# Patient Record
Sex: Female | Born: 1945 | Hispanic: No | Marital: Married | Smoking: Never smoker
Health system: Southern US, Community
[De-identification: ages and names within clinical notes are randomized; demographics above are authoritative.]

## PROBLEM LIST (undated history)

## (undated) DIAGNOSIS — D649 Anemia, unspecified: Secondary | ICD-10-CM

## (undated) DIAGNOSIS — I10 Essential (primary) hypertension: Secondary | ICD-10-CM

## (undated) DIAGNOSIS — E785 Hyperlipidemia, unspecified: Secondary | ICD-10-CM

## (undated) DIAGNOSIS — G629 Polyneuropathy, unspecified: Secondary | ICD-10-CM

## (undated) DIAGNOSIS — E119 Type 2 diabetes mellitus without complications: Secondary | ICD-10-CM

## (undated) HISTORY — PX: COMBINED HYSTEROSCOPY DIAGNOSTIC / D&C: SUR297

---

## 2000-03-18 ENCOUNTER — Other Ambulatory Visit: Admission: RE | Admit: 2000-03-18 | Discharge: 2000-03-18 | Payer: Self-pay | Admitting: Family Medicine

## 2015-02-12 ENCOUNTER — Other Ambulatory Visit: Payer: Self-pay | Admitting: Family Medicine

## 2015-02-12 DIAGNOSIS — R946 Abnormal results of thyroid function studies: Secondary | ICD-10-CM

## 2015-02-17 ENCOUNTER — Ambulatory Visit
Admission: RE | Admit: 2015-02-17 | Discharge: 2015-02-17 | Disposition: A | Discharge status: Home / Self Care | Payer: Self-pay | Source: Ambulatory Visit | Attending: Family Medicine | Admitting: Family Medicine

## 2015-02-17 DIAGNOSIS — R946 Abnormal results of thyroid function studies: Secondary | ICD-10-CM

## 2015-03-24 ENCOUNTER — Ambulatory Visit (INDEPENDENT_AMBULATORY_CARE_PROVIDER_SITE_OTHER): Payer: BLUE CROSS/BLUE SHIELD | Admitting: Internal Medicine

## 2015-03-24 ENCOUNTER — Encounter: Payer: Self-pay | Admitting: Internal Medicine

## 2015-03-24 VITALS — BP 132/78 | HR 99 | Temp 97.8°F | Resp 12 | Ht 62.75 in | Wt 225.0 lb

## 2015-03-24 DIAGNOSIS — E042 Nontoxic multinodular goiter: Secondary | ICD-10-CM | POA: Diagnosis not present

## 2015-03-24 DIAGNOSIS — R946 Abnormal results of thyroid function studies: Secondary | ICD-10-CM

## 2015-03-24 DIAGNOSIS — R7989 Other specified abnormal findings of blood chemistry: Secondary | ICD-10-CM

## 2015-03-24 LAB — TSH: TSH: 0.32 u[IU]/mL — AB (ref 0.35–4.50)

## 2015-03-24 LAB — T3, FREE: T3, Free: 3.8 pg/mL (ref 2.3–4.2)

## 2015-03-24 LAB — T4, FREE: FREE T4: 1.01 ng/dL (ref 0.60–1.60)

## 2015-03-24 NOTE — Patient Instructions (Signed)
Please stop at the lab.  If the thyroid tests are abnormal, we may need a thyroid uptake and scan.  Please return in 4 months.

## 2015-03-24 NOTE — Progress Notes (Signed)
Patient ID: Felicia Davidson, female   DOB: Jul 14, 1945, 70 y.o.   MRN: [id number]   HPI  Felicia Davidson is a 70 y.o.-year-old female, referred by her PCP, Dr. Hal Hope, for evaluation and management of multinodular goiter and a low TSH.  She was found to have a low TSH at last appt with PCP in 01/2015 >> sent for a thyroid U/S.  Thyroid U/S (02/17/2015): Several areas of heterogeneity: There is marked diffuse heterogeneity of the thyroid parenchymal echotexture.  Right thyroid lobe: Borderline enlarged measuring 6.1 x 3.0 x 3.0 cm.  Right , mid, lateral - 1.5 x 0.7 x 1.0 cm - mixed echogenic, predominantly cystic with internal echogenic septations, ill-defined, potentially a pseudo nodule.  *Right, inferior - 2.1 x 2.0 x 1.3 cm - mixed echogenic, solid, ill-defined, potentially pseudo nodule.  There are several additional scattered punctate (sub 9 mm) complex cystic nodules scattered within the remainder the right lobe of the thyroid  Left thyroid lobe: Borderline enlarged measures 6.3 x 2.8 x 2.8 cm.  Left, mid, lateral - 1.4 x 1.0 x 1.2 cm - anechoic cystic with internal echogenic septations  *Left, mid, posterior - 1.7 x 1.1 x 1.9 cm - echogenic, ill-defined, solid, potentially a pseudo nodule.  *Left, inferior, posterior - 1.4 x 1.4 x 2.0 cm - mixed echogenic, solid, ill-defined, potentially pseudo nodule  Isthmus: Borderline enlarged measuring 0.8 cm in diameter.   Left, lateral - 0.4 cm - anechoic, likely cystic with peripheral eccentric nodule suggestive of colloid  Lymphadenopathy: None visualized.  IMPRESSION: Findings suggestive of multi nodular goiter.     Pt denies: - feeling nodules in neck - hoarseness - dysphagia - choking - SOB with lying down  I reviewed pt's thyroid tests: 02/05/2015: TSH 0.285 (0.45-4.5)  Pt c/o: - + fatigue - no heat intolerance/cold intolerance - no tremors - no palpitations - no anxiety/depression - no  hyperdefecation/+ occasional constipation - no weight loss/weight gain - no dry skin - no hair loss  No FH of thyroid ds. No FH of thyroid cancer. No h/o radiation tx to head or neck.  No seaweed or kelp. No recent contrast studies. No steroid use. No herbal supplements. No Biotin supplements or Hair, Skin and Nails vitamins.  Pt also has a history of anemia, heart murmur. She had an EGD this mo >> normal. She also has a h/o HTN, HL.  ROS: Constitutional: see HPI Eyes: no blurry vision, no xerophthalmia ENT: no sore throat, + see HPI, + decreased hearing Cardiovascular: no CP/SOB/palpitations/leg swelling Respiratory: no cough/SOB Gastrointestinal: no N/V/D/C Musculoskeletal:+ muscle/+ joint aches Skin: no rashes Neurological: no tremors/numbness/tingling/dizziness Psychiatric: no depression/anxiety  PMH: see HPI.  PSxH: none  Social History   Social History  . Marital Status: married    Spouse Name: N/A  . Number of Children: 0   Occupational History  . retired   Social History Main Topics  . Smoking status: nonsmoker  . Smokeless tobacco: Not on file  . Alcohol Use: No  . Drug Use: No   Patient's medicines: - Metformin 1000 mg twice a day - Glipizide 10 mg twice a day - Calcium with minerals 600 mg twice a day - Lisinopril 20 mg twice a day - Aspirin 81 mg in a.m. - Centrum Silver 1 tablet in a.m. - Aleve 220 mg twice a day - Iron supplement 325 mg in a.m. - Rosuvastatin 10 mg at supper  Allergies  Allergen Reactions  . Lipitor [Atorvastatin] Other (See Comments)  Sore muscles  . Penicillins Other (See Comments)    Stiff joints   FH: - see HPI - father with heart ds. (deceased)  PE: BP 132/78 mmHg  Pulse 99  Temp(Src) 97.8 F (36.6 C) (Oral)  Resp 12  Ht 5' 2.75" (1.594 m)  Wt 225 lb (102.059 kg)  BMI 40.17 kg/m2  SpO2 97% Wt Readings from Last 3 Encounters:  03/24/15 225 lb (102.059 kg)   Constitutional: obese, in NAD Eyes: PERRLA,  EOMI, no exophthalmos ENT: moist mucous membranes, no thyromegaly, no cervical lymphadenopathy Cardiovascular: RRR, No MRG Respiratory: CTA B Gastrointestinal: abdomen soft, NT, ND, BS+ Musculoskeletal: no deformities, strength intact in all 4;  Skin: moist, warm, no rashes Neurological: no tremor with outstretched hands, DTR normal in all 4  ASSESSMENT: 1. MNG  2. Low TSH  PLAN: 1. MNG  - I reviewed the images of her thyroid ultrasound along with the patient. I pointed out that the dominant nodules are not very large, and they appear more as areas of heterogeneity than definite nodules.   Otherwise, the nodules are: - without microcalcifications - without internal blood flow - more wide than tall Pt does not have a thyroid cancer family history or a personal history of RxTx to head/neck. All these would favor benignity. Also, in the setting of a low TSH, the nodules are most likely inflammatory, and usually inflammatory nodules disappear over time. I would like to repeat her TFTs today and if they are still abnormal, we may need an uptake and scan. I will also check her thyroid antibodies, since an autoimmune thyroid disease can lead to inflammation and pseudo-nodules.  - We discussed about waiting for another 1-2 years to repeat the ultrasound to see if the nodules disappear or become more pronounced. If they appear better defined, we may need biopsies at that time, however, at this time, I believe that they are low risk for cancer.   2. Low TSH - No identifiable exogenous causes for her low TSH - We'll repeat her TFTs today along with TSI, TPO, and ATA antibodies - We may need an uptake and scan if the TFTs return abnormal - Patient's pulse at the beginning of the appointment was 99, however he dropped under 90 during the appointment. No tremors, no palpitations, and no other signs of possible hyperthyroidism. She tells me that she had a higher pulse her entire life.  Return in about  4 months (around 07/24/2015).  Component     Latest Ref Rng 03/24/2015  T4,Free(Direct)     0.60 - 1.60 ng/dL 1.61  Triiodothyronine,Free,Serum     2.3 - 4.2 pg/mL 3.8  TSH     0.35 - 4.50 uIU/mL 0.32 (L)  Thyroglobulin Ab     <2 IU/mL <1  Thyroperoxidase Ab SerPl-aCnc     <9 IU/mL <1  TSI     <140 % baseline <89   TSH is slightly low, which can appear during an episode of mild thyroiditis. No signs of Graves' disease or Hashimoto's thyroiditis. No intervention is needed for now, but I would like to recheck her TFTs when she returns in 4 months. An episode of thyroiditis could cause inflammatory pseudo-nodules. I plan to repeat an ultrasound in 1-2 years, but no intervention is needed for now.

## 2015-03-25 LAB — THYROID PEROXIDASE ANTIBODY

## 2015-03-25 LAB — THYROGLOBULIN ANTIBODY

## 2015-03-31 ENCOUNTER — Encounter: Payer: Self-pay | Admitting: Internal Medicine

## 2015-03-31 ENCOUNTER — Encounter: Payer: Self-pay | Admitting: *Deleted

## 2015-03-31 LAB — THYROID STIMULATING IMMUNOGLOBULIN

## 2015-06-11 ENCOUNTER — Other Ambulatory Visit: Payer: Self-pay | Admitting: Family Medicine

## 2015-06-11 DIAGNOSIS — G459 Transient cerebral ischemic attack, unspecified: Secondary | ICD-10-CM

## 2015-06-11 DIAGNOSIS — R200 Anesthesia of skin: Secondary | ICD-10-CM

## 2015-06-15 ENCOUNTER — Ambulatory Visit
Admission: RE | Admit: 2015-06-15 | Discharge: 2015-06-15 | Disposition: A | Discharge status: Home / Self Care | Payer: BLUE CROSS/BLUE SHIELD | Source: Ambulatory Visit | Attending: Family Medicine | Admitting: Family Medicine

## 2015-06-15 DIAGNOSIS — R200 Anesthesia of skin: Secondary | ICD-10-CM

## 2015-06-15 DIAGNOSIS — G459 Transient cerebral ischemic attack, unspecified: Secondary | ICD-10-CM

## 2015-07-24 ENCOUNTER — Ambulatory Visit (INDEPENDENT_AMBULATORY_CARE_PROVIDER_SITE_OTHER): Payer: BLUE CROSS/BLUE SHIELD | Admitting: Internal Medicine

## 2015-07-24 ENCOUNTER — Encounter: Payer: Self-pay | Admitting: Internal Medicine

## 2015-07-24 VITALS — BP 160/70 | HR 102 | Temp 97.6°F | Ht 62.75 in | Wt 223.4 lb

## 2015-07-24 DIAGNOSIS — E042 Nontoxic multinodular goiter: Secondary | ICD-10-CM | POA: Diagnosis not present

## 2015-07-24 DIAGNOSIS — E0590 Thyrotoxicosis, unspecified without thyrotoxic crisis or storm: Secondary | ICD-10-CM

## 2015-07-24 LAB — TSH: TSH: 0.41 u[IU]/mL (ref 0.35–4.50)

## 2015-07-24 LAB — T3, FREE: T3 FREE: 3.3 pg/mL (ref 2.3–4.2)

## 2015-07-24 LAB — T4, FREE: Free T4: 1.15 ng/dL (ref 0.60–1.60)

## 2015-07-24 NOTE — Progress Notes (Signed)
Patient ID: Felicia Davidson, female   DOB: March 11, 1945, 70 y.o.   MRN: [id number]   HPI  Felicia Davidson is a 70 y.o.-year-old female, returning for f/u for multinodular goiter and subclinical hyperthyroidism. Last visit 4 mo ago.  At last visit, her thyroid antibodies were negative, and her TSH was improving. Free thyroid hormones were normal >> we decided to follow her expectantly.  Reviewed and addended hx: She was found to have a low TSH at last appt with PCP in 01/2015 >> sent for a thyroid U/S.  Thyroid U/S (02/17/2015): Several areas of heterogeneity: There is marked diffuse heterogeneity of the thyroid parenchymal echotexture.  Right thyroid lobe: Borderline enlarged measuring 6.1 x 3.0 x 3.0 cm.  Right , mid, lateral - 1.5 x 0.7 x 1.0 cm - mixed echogenic, predominantly cystic with internal echogenic septations, ill-defined, potentially a pseudo nodule.  *Right, inferior - 2.1 x 2.0 x 1.3 cm - mixed echogenic, solid, ill-defined, potentially pseudo nodule.  There are several additional scattered punctate (sub 9 mm) complex cystic nodules scattered within the remainder the right lobe of the thyroid  Left thyroid lobe: Borderline enlarged measures 6.3 x 2.8 x 2.8 cm.  Left, mid, lateral - 1.4 x 1.0 x 1.2 cm - anechoic cystic with internal echogenic septations  *Left, mid, posterior - 1.7 x 1.1 x 1.9 cm - echogenic, ill-defined, solid, potentially a pseudo nodule.  *Left, inferior, posterior - 1.4 x 1.4 x 2.0 cm - mixed echogenic, solid, ill-defined, potentially pseudo nodule  Isthmus: Borderline enlarged measuring 0.8 cm in diameter.   Left, lateral - 0.4 cm - anechoic, likely cystic with peripheral eccentric nodule suggestive of colloid  Lymphadenopathy: None visualized.  IMPRESSION: Findings suggestive of multi nodular goiter.     Pt denies: - feeling nodules in neck - hoarseness - dysphagia - choking - SOB with lying down  I reviewed pt's thyroid  tests: Component     Latest Ref Rng 03/24/2015  T4,Free(Direct)     0.60 - 1.60 ng/dL 2.76  Triiodothyronine,Free,Serum     2.3 - 4.2 pg/mL 3.8  TSH     0.35 - 4.50 uIU/mL 0.32 (L)  Thyroglobulin Ab     <2 IU/mL <1  Thyroperoxidase Ab SerPl-aCnc     <9 IU/mL <1  TSI     <140 % baseline <89  02/05/2015: TSH 0.285 (0.45-4.5)  Pt c/o: - + fatigue (due to heat) - no heat intolerance/cold intolerance - no tremors - no palpitations - no anxiety/depression - no hyperdefecation/constipation - no weight loss/weight gain - no dry skin - no hair loss  No FH of thyroid ds. No FH of thyroid cancer. No h/o radiation tx to head or neck.  No seaweed or kelp. No recent contrast studies. No steroid use. No herbal supplements. No Biotin supplements or Hair, Skin and Nails vitamins.  Pt also has a history of anemia (iron supplement reduced from 2 tabs to 1 tab a day), heart murmur. She had an EGD this mo >> normal. She also has a h/o HTN, HL.  ROS: Constitutional: see HPI Eyes: no blurry vision, no xerophthalmia ENT: no sore throat, + decreased hearing Cardiovascular: no CP/SOB/palpitations/leg swelling Respiratory: no cough/SOB Gastrointestinal: no N/V/D/C Musculoskeletal:nomuscle/joint aches Skin: no rashes Neurological: no tremors/numbness/tingling/dizziness  I reviewed pt's medications, allergies, PMH, social hx, family hx, and changes were documented in the history of present illness. Otherwise, unchanged from my initial visit note.  PMH: see HPI.  PSxH: none  Social History  Social History  . Marital Status: married    Spouse Name: N/A  . Number of Children: 0   Occupational History  . retired   Social History Main Topics  . Smoking status: nonsmoker  . Smokeless tobacco: Not on file  . Alcohol Use: No  . Drug Use: No   Patient's medicines: - Metformin 1000 mg twice a day - Glipizide 10 mg twice a day - Calcium with minerals 600 mg twice a day - Lisinopril 20  mg twice a day - Aspirin 81 mg in a.m. - Centrum Silver 1 tablet in a.m. - Aleve 220 mg twice a day - Iron supplement 325 mg in a.m. - Rosuvastatin 10 mg at supper  Allergies  Allergen Reactions  . Lipitor [Atorvastatin] Other (See Comments)    Sore muscles  . Penicillins Other (See Comments)    Stiff joints   FH: - see HPI - father with heart ds. (deceased)  PE: BP 160/70 mmHg  Pulse 102  Temp(Src) 97.6 F (36.4 C) (Oral)  Ht 5' 2.75" (1.594 m)  Wt 223 lb 6.4 oz (101.334 kg)  BMI 39.88 kg/m2  SpO2 98% Wt Readings from Last 3 Encounters:  07/24/15 223 lb 6.4 oz (101.334 kg)  03/24/15 225 lb (102.059 kg)   Constitutional: obese, in NAD Eyes: PERRLA, EOMI, no exophthalmos ENT: moist mucous membranes, no thyromegaly, no cervical lymphadenopathy Cardiovascular: RRR, + 2/6 SEM, no RG Respiratory: CTA B Gastrointestinal: abdomen soft, NT, ND, BS+ Musculoskeletal: no deformities, strength intact in all 4;  Skin: moist, warm, no rashes Neurological: no tremor with outstretched hands, DTR normal in all 4  ASSESSMENT: 1. [place] thyrotoxicosis  2. Thyroid nodules  PLAN: 1. [place] thyrotoxicosis - No identifiable exogenous causes for her low TSH >> this is improving: last TSH slightly under the LLN - TSI, TPO, and ATA antibodies were not high at last visit - will repeat TFTs today - We may need an uptake and scan if the TFTs return abnormal - Patient's pulse at the beginning of the appointment was 102, however he dropped under 90 during the appointment. No tremors, no palpitations, and no other signs of possible hyperthyroidism. She was telling me at last visit that she had a higher pulse her entire life.  2. Thyroid nodules  - I reviewed the report of her thyroid ultrasound from 02/2015. The dominant nodules are not very large, and they appear more as areas of heterogeneity than definite nodules (pseudonodules). The are: - without microcalcifications - without internal blood  flow - more wide than tall Pt does not have a thyroid cancer family history or a personal history of RxTx to head/neck. All these would favor benignity. Also, in the setting of a low TSH, the nodules are most likely inflammatory, and usually inflammatory nodules disappear over time. I would like to repeat her TFTs today and if they are still abnormal, we may need an uptake and scan. I will also check a thyroid U/S in ~ 2 years from the previous.  Carlus Pavlov, MD PhD [facility]  Office Visit on 07/24/2015  Component Date Value Ref Range Status  . TSH 07/24/2015 0.41  0.35 - 4.50 uIU/mL Final  . Free T4 07/24/2015 1.15  0.60 - 1.60 ng/dL Final  . T3, Free 09/81/1914 3.3  2.3 - 4.2 pg/mL Final   Normal TFTs.

## 2015-07-24 NOTE — Progress Notes (Signed)
Pre visit review using our clinic review tool, if applicable. No additional management support is needed unless otherwise documented below in the visit note. 

## 2015-07-24 NOTE — Patient Instructions (Signed)
Please stop at the lab.  Please return in 1 year.  

## 2015-10-12 ENCOUNTER — Other Ambulatory Visit: Payer: Self-pay | Admitting: Family Medicine

## 2015-10-12 DIAGNOSIS — E2839 Other primary ovarian failure: Secondary | ICD-10-CM

## 2015-10-16 ENCOUNTER — Ambulatory Visit
Admission: RE | Admit: 2015-10-16 | Discharge: 2015-10-16 | Disposition: A | Discharge status: Home / Self Care | Payer: BLUE CROSS/BLUE SHIELD | Source: Ambulatory Visit | Attending: Family Medicine | Admitting: Family Medicine

## 2015-10-16 DIAGNOSIS — E2839 Other primary ovarian failure: Secondary | ICD-10-CM

## 2016-07-25 ENCOUNTER — Ambulatory Visit: Payer: BLUE CROSS/BLUE SHIELD | Admitting: Internal Medicine

## 2016-10-04 ENCOUNTER — Ambulatory Visit: Payer: BLUE CROSS/BLUE SHIELD | Admitting: Internal Medicine

## 2019-03-07 ENCOUNTER — Ambulatory Visit: Payer: Medicare Other | Attending: Internal Medicine

## 2019-03-07 DIAGNOSIS — Z23 Encounter for immunization: Secondary | ICD-10-CM | POA: Insufficient documentation

## 2019-03-07 NOTE — Progress Notes (Signed)
   Covid-19 Vaccination Clinic  Name:  Shadara Lopez    MRN: [id number] DOB: 1945/12/16  03/07/2019  Ms. Berko was observed post Covid-19 immunization for 15 minutes without incidence. She was provided with Vaccine Information Sheet and instruction to access the V-Safe system.   Ms. Schlauch was instructed to call 911 with any severe reactions post vaccine: Marland Kitchen Difficulty breathing  . Swelling of your face and throat  . A fast heartbeat  . A bad rash all over your body  . Dizziness and weakness    Immunizations Administered    Name Date Dose VIS Date Route   Pfizer COVID-19 Vaccine 03/07/2019 10:53 AM 0.3 mL 12/21/2018 Intramuscular   Manufacturer: [employer], [employer]   Lot: [id number]   NDC: [address]-6

## 2019-04-03 ENCOUNTER — Ambulatory Visit: Payer: Medicare Other | Attending: Internal Medicine

## 2019-04-03 DIAGNOSIS — Z23 Encounter for immunization: Secondary | ICD-10-CM

## 2019-04-03 NOTE — Progress Notes (Signed)
   Covid-19 Vaccination Clinic  Name:  Taziyah Iannuzzi    MRN: [id number] DOB: 06/12/1945  04/03/2019  Ms. Streicher was observed post Covid-19 immunization for 15 minutes without incident. She was provided with Vaccine Information Sheet and instruction to access the V-Safe system.   Ms. Bessler was instructed to call 911 with any severe reactions post vaccine: Marland Kitchen Difficulty breathing  . Swelling of face and throat  . A fast heartbeat  . A bad rash all over body  . Dizziness and weakness   Immunizations Administered    Name Date Dose VIS Date Route   Pfizer COVID-19 Vaccine 04/03/2019 10:32 AM 0.3 mL 12/21/2018 Intramuscular   Manufacturer: [employer], [employer]   Lot: [id number]   NDC: [address]-7

## 2020-11-18 ENCOUNTER — Emergency Department (HOSPITAL_COMMUNITY): Payer: Medicare Other

## 2020-11-18 ENCOUNTER — Inpatient Hospital Stay (HOSPITAL_COMMUNITY)
Admission: EM | Admit: 2020-11-18 | Discharge: 2020-11-22 | DRG: 193 | Disposition: A | Discharge status: Home Health | Payer: Medicare Other | Attending: Internal Medicine | Admitting: Internal Medicine

## 2020-11-18 ENCOUNTER — Encounter (HOSPITAL_COMMUNITY): Payer: Self-pay | Admitting: Internal Medicine

## 2020-11-18 DIAGNOSIS — I5033 Acute on chronic diastolic (congestive) heart failure: Secondary | ICD-10-CM | POA: Diagnosis present

## 2020-11-18 DIAGNOSIS — J9601 Acute respiratory failure with hypoxia: Secondary | ICD-10-CM | POA: Diagnosis present

## 2020-11-18 DIAGNOSIS — Z6835 Body mass index (BMI) 35.0-35.9, adult: Secondary | ICD-10-CM

## 2020-11-18 DIAGNOSIS — E1141 Type 2 diabetes mellitus with diabetic mononeuropathy: Secondary | ICD-10-CM | POA: Diagnosis present

## 2020-11-18 DIAGNOSIS — I110 Hypertensive heart disease with heart failure: Secondary | ICD-10-CM | POA: Diagnosis present

## 2020-11-18 DIAGNOSIS — E875 Hyperkalemia: Secondary | ICD-10-CM | POA: Diagnosis present

## 2020-11-18 DIAGNOSIS — Z20822 Contact with and (suspected) exposure to covid-19: Secondary | ICD-10-CM | POA: Diagnosis present

## 2020-11-18 DIAGNOSIS — J9621 Acute and chronic respiratory failure with hypoxia: Secondary | ICD-10-CM | POA: Diagnosis not present

## 2020-11-18 DIAGNOSIS — R609 Edema, unspecified: Secondary | ICD-10-CM | POA: Diagnosis not present

## 2020-11-18 DIAGNOSIS — Z7984 Long term (current) use of oral hypoglycemic drugs: Secondary | ICD-10-CM | POA: Diagnosis not present

## 2020-11-18 DIAGNOSIS — R0609 Other forms of dyspnea: Secondary | ICD-10-CM | POA: Diagnosis not present

## 2020-11-18 DIAGNOSIS — Z7982 Long term (current) use of aspirin: Secondary | ICD-10-CM | POA: Diagnosis not present

## 2020-11-18 DIAGNOSIS — Z79899 Other long term (current) drug therapy: Secondary | ICD-10-CM

## 2020-11-18 DIAGNOSIS — R7989 Other specified abnormal findings of blood chemistry: Secondary | ICD-10-CM | POA: Diagnosis not present

## 2020-11-18 DIAGNOSIS — R0602 Shortness of breath: Secondary | ICD-10-CM

## 2020-11-18 DIAGNOSIS — Z87891 Personal history of nicotine dependence: Secondary | ICD-10-CM | POA: Diagnosis not present

## 2020-11-18 DIAGNOSIS — E785 Hyperlipidemia, unspecified: Secondary | ICD-10-CM | POA: Diagnosis present

## 2020-11-18 DIAGNOSIS — D539 Nutritional anemia, unspecified: Secondary | ICD-10-CM | POA: Diagnosis present

## 2020-11-18 DIAGNOSIS — E669 Obesity, unspecified: Secondary | ICD-10-CM | POA: Diagnosis present

## 2020-11-18 DIAGNOSIS — J189 Pneumonia, unspecified organism: Principal | ICD-10-CM | POA: Diagnosis present

## 2020-11-18 DIAGNOSIS — J810 Acute pulmonary edema: Secondary | ICD-10-CM | POA: Diagnosis not present

## 2020-11-18 DIAGNOSIS — I10 Essential (primary) hypertension: Secondary | ICD-10-CM | POA: Diagnosis not present

## 2020-11-18 HISTORY — DX: Anemia, unspecified: D64.9

## 2020-11-18 HISTORY — DX: Type 2 diabetes mellitus without complications: E11.9

## 2020-11-18 HISTORY — DX: Polyneuropathy, unspecified: G62.9

## 2020-11-18 LAB — RENAL FUNCTION PANEL
Albumin: 3.9 g/dL (ref 3.5–5.0)
Anion gap: 10 (ref 5–15)
BUN: 30 mg/dL — ABNORMAL HIGH (ref 8–23)
CO2: 24 mmol/L (ref 22–32)
Calcium: 9.5 mg/dL (ref 8.9–10.3)
Chloride: 105 mmol/L (ref 98–111)
Creatinine, Ser: 0.95 mg/dL (ref 0.44–1.00)
GFR, Estimated: >60 mL/min (ref >60)
Glucose, Bld: 154 mg/dL — ABNORMAL HIGH (ref 70–99)
Phosphorus: 3.6 mg/dL (ref 2.5–4.6)
Potassium: 4.3 mmol/L (ref 3.5–5.1)
Sodium: 139 mmol/L (ref 135–145)

## 2020-11-18 LAB — COMPREHENSIVE METABOLIC PANEL
ALT: 12 U/L (ref 0–44)
AST: 24 U/L (ref 15–41)
Albumin: 4.3 g/dL (ref 3.5–5.0)
Alkaline Phosphatase: 106 U/L (ref 38–126)
Anion gap: 9 (ref 5–15)
BUN: 30 mg/dL — ABNORMAL HIGH (ref 8–23)
CO2: 23 mmol/L (ref 22–32)
Calcium: 9.7 mg/dL (ref 8.9–10.3)
Chloride: 104 mmol/L (ref 98–111)
Creatinine, Ser: 0.97 mg/dL (ref 0.44–1.00)
GFR, Estimated: >60 mL/min (ref >60)
Glucose, Bld: 176 mg/dL — ABNORMAL HIGH (ref 70–99)
Potassium: 5.3 mmol/L — ABNORMAL HIGH (ref 3.5–5.1)
Sodium: 136 mmol/L (ref 135–145)
Total Bilirubin: 1.1 mg/dL (ref 0.3–1.2)
Total Protein: 8.3 g/dL — ABNORMAL HIGH (ref 6.5–8.1)

## 2020-11-18 LAB — CBC WITH DIFFERENTIAL/PLATELET
Abs Immature Granulocytes: 0.04 10*3/uL (ref 0.00–0.07)
Basophils Absolute: 0.1 10*3/uL (ref 0.0–0.1)
Basophils Relative: 1 %
Eosinophils Absolute: 0.3 10*3/uL (ref 0.0–0.5)
Eosinophils Relative: 2 %
HCT: 37.5 % (ref 36.0–46.0)
Hemoglobin: 11.8 g/dL — ABNORMAL LOW (ref 12.0–15.0)
Immature Granulocytes: 0 %
Lymphocytes Relative: 11 %
Lymphs Abs: 1.4 10*3/uL (ref 0.7–4.0)
MCH: 30.3 pg (ref 26.0–34.0)
MCHC: 31.5 g/dL (ref 30.0–36.0)
MCV: 96.4 fL (ref 80.0–100.0)
Monocytes Absolute: 0.7 10*3/uL (ref 0.1–1.0)
Monocytes Relative: 6 %
Neutro Abs: 9.9 10*3/uL — ABNORMAL HIGH (ref 1.7–7.7)
Neutrophils Relative %: 80 %
Platelets: 294 10*3/uL (ref 150–400)
RBC: 3.89 MIL/uL (ref 3.87–5.11)
RDW: 13.3 % (ref 11.5–15.5)
WBC: 12.3 10*3/uL — ABNORMAL HIGH (ref 4.0–10.5)
nRBC: 0 % (ref 0.0–0.2)

## 2020-11-18 LAB — GLUCOSE, CAPILLARY
Glucose-Capillary: 136 mg/dL — ABNORMAL HIGH (ref 70–99)
Glucose-Capillary: 80 mg/dL (ref 70–99)

## 2020-11-18 LAB — LACTIC ACID, PLASMA: Lactic Acid, Venous: 0.9 mmol/L (ref 0.5–1.9)

## 2020-11-18 LAB — BRAIN NATRIURETIC PEPTIDE: B Natriuretic Peptide: 134.8 pg/mL — ABNORMAL HIGH (ref 0.0–100.0)

## 2020-11-18 LAB — RESP PANEL BY RT-PCR (FLU A&B, COVID) ARPGX2
Influenza A by PCR: NEGATIVE
Influenza B by PCR: NEGATIVE
SARS Coronavirus 2 by RT PCR: NEGATIVE

## 2020-11-18 LAB — STREP PNEUMONIAE URINARY ANTIGEN: Strep Pneumo Urinary Antigen: NEGATIVE

## 2020-11-18 LAB — HEMOGLOBIN A1C
Hgb A1c MFr Bld: 5.8 % — ABNORMAL HIGH (ref 4.8–5.6)
Mean Plasma Glucose: 119.76 mg/dL

## 2020-11-18 MED ORDER — GUAIFENESIN 100 MG/5ML PO LIQD
5.0000 mL | ORAL | Status: DC | PRN
  Administered 2020-11-18 – 2020-11-21 (×2): 5 mL via ORAL
  Filled 2020-11-18 (×2): qty 10

## 2020-11-18 MED ORDER — INSULIN ASPART 100 UNIT/ML IJ SOLN
0.0000 [IU] | Freq: Three times a day (TID) | INTRAMUSCULAR | Status: DC
  Administered 2020-11-18 – 2020-11-19 (×2): 2 [IU] via SUBCUTANEOUS
  Administered 2020-11-19 – 2020-11-20 (×2): 3 [IU] via SUBCUTANEOUS
  Administered 2020-11-20: 2 [IU] via SUBCUTANEOUS
  Administered 2020-11-21: 5 [IU] via SUBCUTANEOUS
  Administered 2020-11-22: 2 [IU] via SUBCUTANEOUS
  Administered 2020-11-22: 3 [IU] via SUBCUTANEOUS
  Filled 2020-11-18: qty 0.15

## 2020-11-18 MED ORDER — ACETAMINOPHEN 325 MG PO TABS: 650.0000 mg | ORAL_TABLET | Freq: Four times a day (QID) | ORAL | Status: DC | PRN

## 2020-11-18 MED ORDER — INSULIN ASPART 100 UNIT/ML IJ SOLN
0.0000 [IU] | Freq: Every day | INTRAMUSCULAR | Status: DC
  Filled 2020-11-18: qty 0.05

## 2020-11-18 MED ORDER — ACETAMINOPHEN 650 MG RE SUPP: 650.0000 mg | Freq: Four times a day (QID) | RECTAL | Status: DC | PRN

## 2020-11-18 MED ORDER — SODIUM CHLORIDE 0.9 % IV SOLN
500.0000 mg | INTRAVENOUS | Status: DC
  Administered 2020-11-18 – 2020-11-22 (×5): 500 mg via INTRAVENOUS
  Filled 2020-11-18 (×6): qty 500

## 2020-11-18 MED ORDER — HYDRALAZINE HCL 25 MG PO TABS: 25.0000 mg | ORAL_TABLET | Freq: Three times a day (TID) | ORAL | Status: DC | PRN

## 2020-11-18 MED ORDER — SODIUM CHLORIDE 0.9 % IV SOLN: Freq: Once | INTRAVENOUS | Status: AC

## 2020-11-18 MED ORDER — SODIUM CHLORIDE 0.9 % IV SOLN
1.0000 g | INTRAVENOUS | Status: DC
  Administered 2020-11-18 – 2020-11-22 (×5): 1 g via INTRAVENOUS
  Filled 2020-11-18 (×6): qty 10

## 2020-11-18 MED ORDER — ALBUTEROL SULFATE (2.5 MG/3ML) 0.083% IN NEBU
2.5000 mg | INHALATION_SOLUTION | RESPIRATORY_TRACT | Status: DC | PRN
  Administered 2020-11-19: 2.5 mg via RESPIRATORY_TRACT
  Filled 2020-11-18: qty 3

## 2020-11-18 MED ORDER — SODIUM CHLORIDE 0.9 % IV SOLN: INTRAVENOUS | Status: DC

## 2020-11-18 NOTE — ED Notes (Signed)
Pt sitting upright in bed, eating a sandwich. Denies any complaints or concerns at this time.

## 2020-11-18 NOTE — ED Provider Notes (Signed)
[facility]-EMERGENCY DEPT Provider Note   CSN: [id number] Arrival date & time: 11/18/20  0350     History Chief Complaint  Patient presents with   Shortness of Breath    Felicia Davidson is a 75 y.o. female.  75 year old female presents with several days of increased dyspnea on exertion.  Has had increased cough without fever or chills.  Has had positive flu exposures.  No vomiting or diarrhea.  Denies chest pain or chest pressure.  Has noted some increased lower extremity edema.  Patient called EMS and was found be hypoxic and placed on oxygen and transported here.      No past medical history on file.  Patient Active Problem List   Diagnosis Date Noted   Subclinical hyperthyroidism 07/24/2015   Multinodular goiter 03/24/2015    No past surgical history on file.   OB History   No obstetric history on file.     No family history on file.  Social History   Tobacco Use   Smoking status: Former  Substance Use Topics   Alcohol use: No    Alcohol/week: 0.0 standard drinks   Drug use: No    Home Medications Prior to Admission medications   Medication Sig Start Date End Date Taking? Authorizing Provider  aspirin 81 MG tablet Take 81 mg by mouth daily.    [provider]  Calcium Carbonate (CALCIUM 600 PO) Take by mouth. With Minerals 2 times daily    [provider]  ferrous sulfate 325 (65 FE) MG tablet Take 325 mg by mouth daily with breakfast.     [provider]  glipiZIDE (GLUCOTROL) 10 MG tablet Take 10 mg by mouth 2 (two) times daily before a meal.  03/18/15   [provider]  lisinopril (PRINIVIL,ZESTRIL) 20 MG tablet Take 20 mg by mouth 2 (two) times daily with a meal.  03/18/15   [provider]  metFORMIN (GLUCOPHAGE) 1000 MG tablet Take 1,000 mg by mouth daily with breakfast.    [provider]  Multiple Vitamins-Minerals (CENTRUM SILVER ADULT 50+ PO) Take 1 tablet by mouth daily.     [provider]  Naproxen Sodium (ALEVE) 220 MG CAPS Take 2 capsules by mouth 2 (two) times daily with a meal.    [provider]  rosuvastatin (CRESTOR) 10 MG tablet Take 10 mg by mouth daily.  03/18/15   [provider]    Allergies    Lipitor [atorvastatin] and Penicillins  Review of Systems   Review of Systems  All other systems reviewed and are negative.  Physical Exam Updated Vital Signs BP (!) 174/62 (BP Location: Right Arm)   Pulse 78   Temp 97.8 F (36.6 C) (Oral)   Resp (!) 22   SpO2 99%   Physical Exam Vitals and nursing note reviewed.  Constitutional:      General: She is not in acute distress.    Appearance: Normal appearance. She is well-developed. She is not toxic-appearing.  HENT:     Head: Normocephalic and atraumatic.  Eyes:     General: Lids are normal.     Conjunctiva/sclera: Conjunctivae normal.     Pupils: Pupils are equal, round, and reactive to light.  Neck:     Thyroid: No thyroid mass.     Trachea: No tracheal deviation.  Cardiovascular:     Rate and Rhythm: Normal rate and regular rhythm.     Heart sounds: Normal heart sounds. No murmur heard.   No  gallop.  Pulmonary:     Effort: Pulmonary effort is normal. No respiratory distress.     Breath sounds: No stridor. Examination of the right-lower field reveals decreased breath sounds and rales. Examination of the left-lower field reveals decreased breath sounds and rales. Decreased breath sounds and rales present. No wheezing or rhonchi.  Abdominal:     General: There is no distension.     Palpations: Abdomen is soft.     Tenderness: There is no abdominal tenderness. There is no rebound.  Musculoskeletal:        General: No tenderness. Normal range of motion.     Cervical back: Normal range of motion and neck supple.  Lymphadenopathy:     Comments: 3 plus le edema  Skin:    General: Skin is warm and dry.     Findings: No abrasion or rash.  Neurological:     Mental  Status: She is alert and oriented to person, place, and time. Mental status is at baseline.     GCS: GCS eye subscore is 4. GCS verbal subscore is 5. GCS motor subscore is 6.     Cranial Nerves: No cranial nerve deficit.     Sensory: No sensory deficit.     Motor: Motor function is intact.  Psychiatric:        Attention and Perception: Attention normal.        Speech: Speech normal.        Behavior: Behavior normal.    ED Results / Procedures / Treatments   Labs (all labs ordered are listed, but only abnormal results are displayed) Labs Reviewed - No data to display  EKG None  Radiology No results found.  Procedures Procedures   Medications Ordered in ED Medications  0.9 %  sodium chloride infusion (has no administration in time range)    ED Course  I have reviewed the triage vital signs and the nursing notes.  Pertinent labs & imaging results that were available during my care of the patient were reviewed by me and considered in my medical decision making (see chart for details).    MDM Rules/Calculators/A&P                           Patient's chest x-ray consistent with pneumonia.  She is requiring oxygen at this time.  Started on IV antibiotics and will be admitted to the hospitalist Final Clinical Impression(s) / ED Diagnoses Final diagnoses:  None    Rx / DC Orders ED Discharge Orders     None        Lorre Nick, MD 11/18/20 1038

## 2020-11-18 NOTE — H&P (Signed)
History and Physical    Jasdeep Dejarnett UXN:[id number] DOB: February 11, 1945 DOA: 11/18/2020  PCP: Kaleen Mask, MD  Patient coming from: Home  Chief Complaint: Shortness of breath  HPI: Felicia Davidson is a 75 y.o. female with medical history significant of DM2, HTN, HLD. Presenting with dyspnea. Started 4 days ago. She noticed that her regular activities were taking her a longer time to complete because she was having difficulty catching her breath. When she rested, she was able to catch up to herself, but she felt exceedingly tired. Her symptoms progressed through this morning. She didn't have any cough or fever. She just felt increasingly weak. Her husband called for EMS and she was brought to the ED. She denies any other aggravating or alleviating factors.    ED Course: Imaging showed BLL infiltrated. She required 4L [place]. She was started on rocephin/zithro. [facility] was called for admission.   Review of Systems:  Denies CP, abdominal pain, N/V/D, fever, sick contacts. Review of systems is otherwise negative for all not mentioned in HPI.   PMHx DM2 HTN HLD  PSHx No past surgical history on file.  SocHx  reports that she has quit smoking. She does not have any smokeless tobacco history on file. She reports that she does not drink alcohol and does not use drugs.  Allergies  Allergen Reactions   Lipitor [Atorvastatin] Other (See Comments)    Sore muscles   Penicillins Other (See Comments)    Stiff joints    FamHx No family history on file.  Prior to Admission medications   Medication Sig Start Date End Date Taking? Authorizing Provider  aspirin 81 MG tablet Take 81 mg by mouth daily.    [provider]  Calcium Carbonate (CALCIUM 600 PO) Take by mouth. With Minerals 2 times daily    [provider]  ferrous sulfate 325 (65 FE) MG tablet Take 325 mg by mouth daily with breakfast.     [provider]  glipiZIDE (GLUCOTROL) 10 MG tablet Take 10 mg by  mouth 2 (two) times daily before a meal.  03/18/15   [provider]  lisinopril (PRINIVIL,ZESTRIL) 20 MG tablet Take 20 mg by mouth 2 (two) times daily with a meal.  03/18/15   [provider]  metFORMIN (GLUCOPHAGE) 1000 MG tablet Take 1,000 mg by mouth daily with breakfast.    [provider]  Multiple Vitamins-Minerals (CENTRUM SILVER ADULT 50+ PO) Take 1 tablet by mouth daily.    [provider]  Naproxen Sodium (ALEVE) 220 MG CAPS Take 2 capsules by mouth 2 (two) times daily with a meal.    [provider]  rosuvastatin (CRESTOR) 10 MG tablet Take 10 mg by mouth daily.  03/18/15   [provider]    Physical Exam: Vitals:   11/18/20 0843 11/18/20 0846 11/18/20 0915 11/18/20 0945  BP:  (!) 174/62 (!) 158/70 (!) 159/59  Pulse:  78 76 76  Resp:  (!) 22 (!) 24 19  Temp:  97.8 F (36.6 C)    TempSrc:  Oral    SpO2: 96% 99% 99% 99%    General: 75 y.o. female resting in bed in NAD Eyes: PERRL, normal sclera ENMT: Nares patent w/o discharge, orophaynx clear, dentition normal, ears w/o discharge/lesions/ulcers Neck: Supple, trachea midline Cardiovascular: RRR, +S1, S2, no m/g/r, equal pulses throughout Respiratory: decreased at bases, no w/r/r, slightly increased WOB on 4L [place] GI: BS+, NDNT, no masses noted, no organomegaly noted MSK: No c/c; trace BL  pedal edema Skin: No rashes, bruises, ulcerations noted Neuro: A&O x 3, no focal deficits Psyc: Appropriate interaction and affect, calm/cooperative  Labs on Admission: I have personally reviewed following labs and imaging studies  CBC: Recent Labs  Lab 11/18/20 0847  WBC 12.3*  NEUTROABS 9.9*  HGB 11.8*  HCT 37.5  MCV 96.4  PLT 294   Basic Metabolic Panel: Recent Labs  Lab 11/18/20 0847  NA 136  K 5.3*  CL 104  CO2 23  GLUCOSE 176*  BUN 30*  CREATININE 0.97  CALCIUM 9.7   GFR: CrCl cannot be calculated (Unknown ideal weight.). Liver Function Tests: Recent Labs   Lab 11/18/20 0847  AST 24  ALT 12  ALKPHOS 106  BILITOT 1.1  PROT 8.3*  ALBUMIN 4.3   No results for input(s): LIPASE, AMYLASE in the last 168 hours. No results for input(s): AMMONIA in the last 168 hours. Coagulation Profile: No results for input(s): INR, PROTIME in the last 168 hours. Cardiac Enzymes: No results for input(s): CKTOTAL, CKMB, CKMBINDEX, TROPONINI in the last 168 hours. BNP (last 3 results) No results for input(s): PROBNP in the last 8760 hours. HbA1C: No results for input(s): HGBA1C in the last 72 hours. CBG: No results for input(s): GLUCAP in the last 168 hours. Lipid Profile: No results for input(s): CHOL, HDL, LDLCALC, TRIG, CHOLHDL, LDLDIRECT in the last 72 hours. Thyroid Function Tests: No results for input(s): TSH, T4TOTAL, FREET4, T3FREE, THYROIDAB in the last 72 hours. Anemia Panel: No results for input(s): VITAMINB12, FOLATE, FERRITIN, TIBC, IRON, RETICCTPCT in the last 72 hours. Urine analysis: No results found for: COLORURINE, APPEARANCEUR, LABSPEC, PHURINE, GLUCOSEU, HGBUR, BILIRUBINUR, KETONESUR, PROTEINUR, UROBILINOGEN, NITRITE, LEUKOCYTESUR  Radiological Exams on Admission: DG Chest Port 1 View  Result Date: 11/18/2020 CLINICAL DATA:  Shortness of breath EXAM: PORTABLE CHEST 1 VIEW COMPARISON:  None. FINDINGS: Heart size is within normal limits. Mediastinum appears normal. Pulmonary vasculature is normal. Increased hazy and irregular opacities in the bilateral lower lung zones. No pneumothorax or significant pleural effusion. IMPRESSION: Increased opacities in the bilateral lower lung zones suspicious for infiltrate/pneumonia. Electronically Signed   By: Jannifer Hick M.D.   On: 11/18/2020 09:28    EKG: Independently reviewed. Sinus, LBBB  Assessment/Plan CAP/BLL PNA     - admit to inpt     - BLL infiltrates seen on imaging, she's on 4L [place] and satting well     - COVID/Flu negative; needs her flu shot     - check urine legionella/strep      - continue rocephin, zithro     - add IS, PRN nebs, wean O2 as able      DM2     - SSI, A1c, glucose checks, DM diet  HTN     - hold home ACEi for now d/t hyperK+     - will have PRNs available  Hyperkalemia     - fluids; rpt K+ this evening     - hold home ACEi  HLD     - continue home statin when confirmed  DVT prophylaxis: SCDs  Code Status: FULL  Family Communication: w/ husband at bedside  Consults called: None   Status is: Inpatient  Remains inpatient appropriate because: severity of illness  Teddy Spike DO [facility]  If 7PM-7AM, please contact night-coverage [email or web address]  11/18/2020, 11:12 AM

## 2020-11-18 NOTE — ED Triage Notes (Signed)
Pt presents to the ED via EMS for SOB. Per EMS, pt's initial O2 sats were 88% on scene on room air. Pt received a neb tx en route via EMS. No past pulmonary hx. Per pt, she has been exposed to flu. Pt does not normally require supplemental O2 and is currently on 4L [place]. Pt reports her sx began a few days ago with accompanying sx of mild intermittent cough. She denies any additional complaints including fever, chills, chest pain, abd pain, N/V/D. She reports her SOB is worse with exertion.

## 2020-11-18 NOTE — ED Notes (Signed)
Pt taken off of 4L [place]. Satting mid 90's.

## 2020-11-19 ENCOUNTER — Inpatient Hospital Stay (HOSPITAL_COMMUNITY): Payer: Medicare Other

## 2020-11-19 ENCOUNTER — Encounter (HOSPITAL_COMMUNITY): Payer: Self-pay | Admitting: Internal Medicine

## 2020-11-19 ENCOUNTER — Other Ambulatory Visit: Payer: Self-pay

## 2020-11-19 DIAGNOSIS — R7989 Other specified abnormal findings of blood chemistry: Secondary | ICD-10-CM | POA: Diagnosis not present

## 2020-11-19 DIAGNOSIS — R609 Edema, unspecified: Secondary | ICD-10-CM

## 2020-11-19 DIAGNOSIS — J189 Pneumonia, unspecified organism: Principal | ICD-10-CM

## 2020-11-19 DIAGNOSIS — R0609 Other forms of dyspnea: Secondary | ICD-10-CM | POA: Diagnosis not present

## 2020-11-19 DIAGNOSIS — J9621 Acute and chronic respiratory failure with hypoxia: Secondary | ICD-10-CM

## 2020-11-19 DIAGNOSIS — J9601 Acute respiratory failure with hypoxia: Secondary | ICD-10-CM | POA: Diagnosis not present

## 2020-11-19 DIAGNOSIS — I10 Essential (primary) hypertension: Secondary | ICD-10-CM

## 2020-11-19 LAB — BLOOD GAS, ARTERIAL
Acid-base deficit: 0.2 mmol/L (ref 0.0–2.0)
Bicarbonate: 24.5 mmol/L (ref 20.0–28.0)
Drawn by: 560031
Expiratory PAP: 6
FIO2: 50
Inspiratory PAP: 12
Mode: POSITIVE
O2 Saturation: 98.2 %
Patient temperature: 98.6
RATE: 12 resp/min
pCO2 arterial: 43 mmHg (ref 32.0–48.0)
pH, Arterial: 7.375 (ref 7.350–7.450)
pO2, Arterial: 104 mmHg (ref 83.0–108.0)

## 2020-11-19 LAB — RESPIRATORY PANEL BY PCR

## 2020-11-19 LAB — COMPREHENSIVE METABOLIC PANEL
ALT: 9 U/L (ref 0–44)
AST: 13 U/L — ABNORMAL LOW (ref 15–41)
Albumin: 3.2 g/dL — ABNORMAL LOW (ref 3.5–5.0)
Alkaline Phosphatase: 72 U/L (ref 38–126)
Anion gap: 5 (ref 5–15)
BUN: 26 mg/dL — ABNORMAL HIGH (ref 8–23)
CO2: 26 mmol/L (ref 22–32)
Calcium: 8.8 mg/dL — ABNORMAL LOW (ref 8.9–10.3)
Chloride: 105 mmol/L (ref 98–111)
Creatinine, Ser: 0.99 mg/dL (ref 0.44–1.00)
GFR, Estimated: 60 mL/min — ABNORMAL LOW (ref >60)
Glucose, Bld: 127 mg/dL — ABNORMAL HIGH (ref 70–99)
Potassium: 4.1 mmol/L (ref 3.5–5.1)
Sodium: 136 mmol/L (ref 135–145)
Total Bilirubin: 0.6 mg/dL (ref 0.3–1.2)
Total Protein: 6.2 g/dL — ABNORMAL LOW (ref 6.5–8.1)

## 2020-11-19 LAB — GLUCOSE, CAPILLARY
Glucose-Capillary: 168 mg/dL — ABNORMAL HIGH (ref 70–99)
Glucose-Capillary: 105 mg/dL — ABNORMAL HIGH (ref 70–99)
Glucose-Capillary: 135 mg/dL — ABNORMAL HIGH (ref 70–99)
Glucose-Capillary: 119 mg/dL — ABNORMAL HIGH (ref 70–99)

## 2020-11-19 LAB — CBC
HCT: 27.7 % — ABNORMAL LOW (ref 36.0–46.0)
Hemoglobin: 9 g/dL — ABNORMAL LOW (ref 12.0–15.0)
MCH: 31 pg (ref 26.0–34.0)
MCHC: 32.5 g/dL (ref 30.0–36.0)
MCV: 95.5 fL (ref 80.0–100.0)
Platelets: 222 10*3/uL (ref 150–400)
RBC: 2.9 MIL/uL — ABNORMAL LOW (ref 3.87–5.11)
RDW: 13.2 % (ref 11.5–15.5)
WBC: 9.4 10*3/uL (ref 4.0–10.5)
nRBC: 0 % (ref 0.0–0.2)

## 2020-11-19 LAB — ECHOCARDIOGRAM COMPLETE
AR max vel: 2 cm2
AV Area VTI: 2.07 cm2
AV Area mean vel: 1.98 cm2
AV Mean grad: 12 mmHg
AV Peak grad: 20.3 mmHg
Ao pk vel: 2.25 m/s
Area-P 1/2: 5.93 cm2
Height: 63.5 in
P 1/2 time: 346 msec
S' Lateral: 2.9 cm
Weight: 3248.7 oz

## 2020-11-19 LAB — PROCALCITONIN: Procalcitonin: 0.15 ng/mL

## 2020-11-19 LAB — MRSA NEXT GEN BY PCR, NASAL: MRSA by PCR Next Gen: NOT DETECTED

## 2020-11-19 LAB — D-DIMER, QUANTITATIVE: D-Dimer, Quant: 3.89 ug/mL-FEU — ABNORMAL HIGH (ref 0.00–0.50)

## 2020-11-19 MED ORDER — CHLORHEXIDINE GLUCONATE 0.12 % MT SOLN
15.0000 mL | Freq: Two times a day (BID) | OROMUCOSAL | Status: DC
  Administered 2020-11-19 – 2020-11-21 (×6): 15 mL via OROMUCOSAL
  Filled 2020-11-19 (×6): qty 15

## 2020-11-19 MED ORDER — FUROSEMIDE 10 MG/ML IJ SOLN
20.0000 mg | Freq: Once | INTRAMUSCULAR | Status: AC
  Administered 2020-11-19: 20 mg via INTRAVENOUS
  Filled 2020-11-19: qty 2

## 2020-11-19 MED ORDER — ENOXAPARIN SODIUM 40 MG/0.4ML IJ SOSY
40.0000 mg | PREFILLED_SYRINGE | INTRAMUSCULAR | Status: DC
  Administered 2020-11-19 – 2020-11-21 (×3): 40 mg via SUBCUTANEOUS
  Filled 2020-11-19 (×3): qty 0.4

## 2020-11-19 MED ORDER — SALINE SPRAY 0.65 % NA SOLN
1.0000 | NASAL | Status: DC | PRN
  Administered 2020-11-20: 1 via NASAL
  Filled 2020-11-19: qty 44

## 2020-11-19 MED ORDER — HYDRALAZINE HCL 20 MG/ML IJ SOLN: 10.0000 mg | Freq: Three times a day (TID) | INTRAMUSCULAR | Status: DC | PRN

## 2020-11-19 MED ORDER — IPRATROPIUM-ALBUTEROL 0.5-2.5 (3) MG/3ML IN SOLN: 3.0000 mL | Freq: Four times a day (QID) | RESPIRATORY_TRACT | Status: DC | PRN

## 2020-11-19 MED ORDER — FUROSEMIDE 10 MG/ML IJ SOLN
20.0000 mg | Freq: Once | INTRAMUSCULAR | Status: AC
  Administered 2020-11-19: 20 mg via INTRAVENOUS

## 2020-11-19 MED ORDER — ORAL CARE MOUTH RINSE
15.0000 mL | Freq: Two times a day (BID) | OROMUCOSAL | Status: DC
  Administered 2020-11-19 – 2020-11-21 (×6): 15 mL via OROMUCOSAL

## 2020-11-19 MED ORDER — CHLORHEXIDINE GLUCONATE CLOTH 2 % EX PADS
6.0000 | MEDICATED_PAD | Freq: Every day | CUTANEOUS | Status: DC
  Administered 2020-11-19 – 2020-11-21 (×3): 6 via TOPICAL

## 2020-11-19 NOTE — Progress Notes (Signed)
Bilateral lower extremity venous duplex has been completed. Preliminary results can be found in CV Proc through chart review.   11/19/20 11:15 AM Olen Cordial RVT

## 2020-11-19 NOTE — Progress Notes (Signed)
  Echocardiogram 2D Echocardiogram has been performed.  Pieter Partridge 11/19/2020, 2:13 PM

## 2020-11-19 NOTE — Progress Notes (Signed)
   11/19/20 0640  Assess: MEWS Score  Temp 98.6 F (37 C)  BP (!) 202/78  Pulse Rate (!) 104 (recd  albuterol NEB)  Resp (!) 24  Level of Consciousness Alert  O2 Device Non-rebreather Mask  Patient Activity (if Appropriate) In bed  O2 Flow Rate (L/min) 15 L/min  Assess: MEWS Score  MEWS Temp 0  MEWS Systolic 2  MEWS Pulse 1  MEWS RR 1  MEWS LOC 0  MEWS Score 4  MEWS Score Color Red  Assess: if the MEWS score is Yellow or Red  Were vital signs taken at a resting state? Yes  Focused Assessment Change from prior assessment (see assessment flowsheet)  Does the patient meet 2 or more of the SIRS criteria? Yes  Does the patient have a confirmed or suspected source of infection? Yes  Provider and Rapid Response Notified? Yes  MEWS guidelines implemented *See Row Information* Yes  Treat  MEWS Interventions Escalated (See documentation below)  Pain Scale 0-10  Pain Score 0  Take Vital Signs  Increase Vital Sign Frequency  Red: Q 1hr X 4 then Q 4hr X 4, if remains red, continue Q 4hrs  Escalate  MEWS: Escalate Red: discuss with charge nurse/RN and provider, consider discussing with RRT  Notify: Charge Nurse/RN  Name of Charge Nurse/RN Notified Tom R   Date Charge Nurse/RN Notified 11/19/20  Time Charge Nurse/RN Notified 0645  Notify: Provider  Provider Name/Title Garner Nash  Date Provider Notified 11/19/20  Time Provider Notified [phone]  Notification Type Page  Notification Reason Change in status  Provider response See new orders  Date of Provider Response 11/19/20  Time of Provider Response 0650  Notify: Rapid Response  Name of Rapid Response RN Notified Mandy  Date Rapid Response Notified 11/19/20  Time Rapid Response Notified 0650  Document  Patient Outcome Transferred/level of care increased  Progress note created (see row info) Yes  Assess: SIRS CRITERIA  SIRS Temperature  0  SIRS Pulse 1  SIRS Respirations  1  SIRS WBC 0  SIRS Score Sum  2

## 2020-11-19 NOTE — Progress Notes (Signed)
PROGRESS NOTE  Felicia Davidson DGU:[id number] DOB: May 10, 1945 DOA: 11/18/2020 PCP: Kaleen Mask, MD  HPI/Recap of past 24 hours: Felicia Davidson is a 75 y.o. female with medical history significant of DM2, HTN, HLD. Presenting with worsening dyspnea and generalized weakness that started 4 days ago. She noticed that her regular activities were taking her a longer time to complete because of dyspnea. Denied any cough or fever. Due to worsening symptoms, pt presented to the ED. In the ED, pt noted to be tachypneic, hypoxic, requiring about 4 L of O2, with BP around 174/62.  Labs showed WBC 12.3, potassium 5.3, BNP 134.8, otherwise unremarkable.  Chest x-ray showed bilateral lower lobe hazy opacities concerning for pneumonia.  Patient admitted for further management.    Early this am, patient was noted to be dyspneic, diaphoretic, hypertensive with sats dropping to 77% on 2 L shortly after ambulating to the bathroom.  Rapid response was activated, patient was given nebulizer, lasix and placed on a nonrebreather and transferred to SDU for closer observation and BiPAP use.  Patient was awake/alert/oriented x4 throughout the episode.  In SDU, patient was then placed on BiPAP, another dose of Lasix was given.  PCCM consulted.     Assessment/Plan: Active Problems:   PNA (pneumonia)   Acute respiratory failure with hypoxia BLL CAP Acute/flash pulmonary edema Currently on bipap, plan to wean off to nasal cannula ABG on BiPAP unremarkable Currently afebrile, with no leukocytosis Chest x-ray with noted bilateral infiltrates Procalcitonin 0.15, trend COVID, flu negative Respiratory viral panel pending Continue azithromycin, Rocephin Received IV Lasix 40 mg, further dosing dependent on clinical status Duo nebs, cough suppressants as needed Supplemental O2 as needed Monitor closely  Elevated BNP 2+ bilateral lower extremity edema ECHO pending  May need prn lasix pending clinical  status  Elevated D-dimer D-dimer 3.89 DVT Doppler BLE, negative for DVT Plan for CTA chest if no significant improvement in the next day or 2  Hypertension Uncontrolled Hold home Norvasc, Coreg, Benicar due to BiPAP status for now IV hydralazine as needed  Diabetes mellitus type 2, controlled Last A1c 5.8 SSI, Accu-Cheks, hypoglycemic protocol Hold home metformin, glipizide  Normocytic/deficiency anemia Hemoglobin dropped from 11.8 -->9, possible hemodilution Anemia panel pending Hold home oral iron supplementation for now Daily CBC  Hyperlipidemia Hold home Crestor for now  Obesity Lifestyle modification advised   Estimated body mass index is 35.4 kg/m as calculated from the following:   Height as of this encounter: 5' 3.5" (1.613 m).   Weight as of this encounter: 92.1 kg.     Code Status: Full  Family Communication: None at bedside  Disposition Plan: Status is: Inpatient  Remains inpatient appropriate because: Level of care    Consultants: PCCM  Procedures: None  Antimicrobials: Azithromycin Ceftriaxone  DVT prophylaxis: Lovenox     Objective: Vitals:   11/19/20 0648 11/19/20 0657 11/19/20 0738 11/19/20 0800  BP: (!) 199/74 (!) 183/72 (!) 193/55 (!) 145/45  Pulse: (!) 104 (!) 104 94 78  Resp: (!) 24 (!) 24 (!) 28 17  Temp: 98.7 F (37.1 C) 98.6 F (37 C)  (!) 96.7 F (35.9 C)  TempSrc: Axillary Axillary  Axillary  SpO2:   99% 100%  Weight:   92.1 kg   Height:   5' 3.5" (1.613 m)     Intake/Output Summary (Last 24 hours) at 11/19/2020 1340 Last data filed at 11/19/2020 0300 Gross per 24 hour  Intake 1576.03 ml  Output 1 ml  Net 1575.03  ml   Filed Weights   11/19/20 0012 11/19/20 0738  Weight: 91.1 kg 92.1 kg    Exam: General: Moderate distress, increased work of breathing, AAO Cardiovascular: S1, S2 present Respiratory: Diffuse rhonchi noted bilaterally Abdomen: Soft, nontender, nondistended, bowel sounds  present Musculoskeletal: 2+ bilateral pedal edema noted Skin: Noted erythematous macules noted on BLE Psychiatry: Normal mood     Data Reviewed: CBC: Recent Labs  Lab 11/18/20 0847 11/19/20 0509  WBC 12.3* 9.4  NEUTROABS 9.9*  --   HGB 11.8* 9.0*  HCT 37.5 27.7*  MCV 96.4 95.5  PLT 294 222   Basic Metabolic Panel: Recent Labs  Lab 11/18/20 0847 11/18/20 1435 11/19/20 0509  NA 136 139 136  K 5.3* 4.3 4.1  CL 104 105 105  CO2 23 24 26   GLUCOSE 176* 154* 127*  BUN 30* 30* 26*  CREATININE 0.97 0.95 0.99  CALCIUM 9.7 9.5 8.8*  PHOS  --  3.6  --    GFR: Estimated Creatinine Clearance: 54.3 mL/min (by C-G formula based on SCr of 0.99 mg/dL). Liver Function Tests: Recent Labs  Lab 11/18/20 0847 11/18/20 1435 11/19/20 0509  AST 24  --  13*  ALT 12  --  9  ALKPHOS 106  --  72  BILITOT 1.1  --  0.6  PROT 8.3*  --  6.2*  ALBUMIN 4.3 3.9 3.2*   No results for input(s): LIPASE, AMYLASE in the last 168 hours. No results for input(s): AMMONIA in the last 168 hours. Coagulation Profile: No results for input(s): INR, PROTIME in the last 168 hours. Cardiac Enzymes: No results for input(s): CKTOTAL, CKMB, CKMBINDEX, TROPONINI in the last 168 hours. BNP (last 3 results) No results for input(s): PROBNP in the last 8760 hours. HbA1C: Recent Labs    11/18/20 1430  HGBA1C 5.8*   CBG: Recent Labs  Lab 11/18/20 1641 11/18/20 2133 11/19/20 0808 11/19/20 1240  GLUCAP 136* 80 168* 105*   Lipid Profile: No results for input(s): CHOL, HDL, LDLCALC, TRIG, CHOLHDL, LDLDIRECT in the last 72 hours. Thyroid Function Tests: No results for input(s): TSH, T4TOTAL, FREET4, T3FREE, THYROIDAB in the last 72 hours. Anemia Panel: No results for input(s): VITAMINB12, FOLATE, FERRITIN, TIBC, IRON, RETICCTPCT in the last 72 hours. Urine analysis: No results found for: COLORURINE, APPEARANCEUR, LABSPEC, PHURINE, GLUCOSEU, HGBUR, BILIRUBINUR, KETONESUR, PROTEINUR, UROBILINOGEN,  NITRITE, LEUKOCYTESUR Sepsis Labs: @LABRCNTIP (procalcitonin:4,lacticidven:4)  ) Recent Results (from the past 240 hour(s))  Resp Panel by RT-PCR (Flu A&B, Covid) Nasopharyngeal Swab     Status: None   Collection Time: 11/18/20  8:47 AM   Specimen: Nasopharyngeal Swab; Nasopharyngeal(NP) swabs in vial transport medium  Result Value Ref Range Status   SARS Coronavirus 2 by RT PCR NEGATIVE NEGATIVE Final    Comment: (NOTE) SARS-CoV-2 target nucleic acids are NOT DETECTED.  The SARS-CoV-2 RNA is generally detectable in upper respiratory specimens during the acute phase of infection. The lowest concentration of SARS-CoV-2 viral copies this assay can detect is 138 copies/mL. A negative result does not preclude SARS-Cov-2 infection and should not be used as the sole basis for treatment or other patient management decisions. A negative result may occur with  improper specimen collection/handling, submission of specimen other than nasopharyngeal swab, presence of viral mutation(s) within the areas targeted by this assay, and inadequate number of viral copies(<138 copies/mL). A negative result must be combined with clinical observations, patient history, and epidemiological information. The expected result is Negative.  Fact Sheet for Patients:   Fact  Sheet for Healthcare Providers:  SeriousBroker.it  This test is no t yet approved or cleared by the [place] FDA and  has been authorized for detection and/or diagnosis of SARS-CoV-2 by FDA under an Emergency Use Authorization (EUA). This EUA will remain  in effect (meaning this test can be used) for the duration of the COVID-19 declaration under Section 564(b)(1) of the Act, 21 U.S.C.section 360bbb-3(b)(1), unless the authorization is terminated  or revoked sooner.       Influenza A by PCR NEGATIVE NEGATIVE Final   Influenza B by PCR NEGATIVE NEGATIVE Final     Comment: (NOTE) The Xpert Xpress SARS-CoV-2/FLU/RSV plus assay is intended as an aid in the diagnosis of influenza from Nasopharyngeal swab specimens and should not be used as a sole basis for treatment. Nasal washings and aspirates are unacceptable for Xpert Xpress SARS-CoV-2/FLU/RSV testing.  Fact Sheet for Patients: BloggerCourse.com  Fact Sheet for Healthcare Providers: SeriousBroker.it  This test is not yet approved or cleared by the [place] FDA and has been authorized for detection and/or diagnosis of SARS-CoV-2 by FDA under an Emergency Use Authorization (EUA). This EUA will remain in effect (meaning this test can be used) for the duration of the COVID-19 declaration under Section 564(b)(1) of the Act, 21 U.S.C. section 360bbb-3(b)(1), unless the authorization is terminated or revoked.  Performed at [facility], [address] [place], [place] [address]   Culture, blood (Routine X 2) w Reflex to ID Panel     Status: None (Preliminary result)   Collection Time: 11/18/20 10:13 AM   Specimen: BLOOD  Result Value Ref Range Status   Specimen Description   Final    BLOOD BLOOD RIGHT HAND Performed at [facility], [address], [place], [place] [address]    Special Requests   Final    BOTTLES DRAWN AEROBIC ONLY Blood Culture adequate volume Performed at [facility], [address], [place], [place] [address]    Culture   Final    NO GROWTH < 24 HOURS Performed at [facility], [address], [place], [place] [address]    Report Status PENDING  Incomplete  Culture, blood (Routine X 2) w Reflex to ID Panel     Status: None (Preliminary result)   Collection Time: 11/18/20 10:18 AM   Specimen: BLOOD  Result Value Ref Range Status   Specimen Description   Final    BLOOD RIGHT ANTECUBITAL Performed at [facility], [address], [place], [place] [address]    Special Requests   Final    BOTTLES DRAWN AEROBIC AND ANAEROBIC Blood Culture adequate volume Performed at [facility], [address], [place], [place] [address]    Culture   Final    NO GROWTH < 24 HOURS Performed at [facility], [address], [place], [place] [address]    Report Status PENDING  Incomplete  MRSA Next Gen by PCR, Nasal     Status: None   Collection Time: 11/19/20  7:38 AM   Specimen: Nasal Mucosa; Nasal Swab  Result Value Ref Range Status   MRSA by PCR Next Gen NOT DETECTED NOT DETECTED Final    Comment: (NOTE) The GeneXpert MRSA Assay (FDA approved for NASAL specimens only), is one component of a comprehensive MRSA colonization surveillance program. It is not intended to diagnose MRSA infection nor to guide or monitor treatment for MRSA infections. Test performance is not FDA  approved in patients less than 23 years old. Performed at [facility], [address], [place], [place] [address]   Respiratory (~20 pathogens) panel by PCR     Status: None   Collection Time: 11/19/20  9:56 AM   Specimen: Nasopharyngeal Swab; Respiratory  Result Value Ref Range Status   Adenovirus NOT DETECTED NOT DETECTED Final   Coronavirus 229E NOT DETECTED NOT DETECTED Final    Comment: (NOTE) The Coronavirus on the Respiratory Panel, DOES NOT test for the novel  Coronavirus (2019 nCoV)    Coronavirus HKU1 NOT DETECTED NOT DETECTED Final   Coronavirus NL63 NOT DETECTED NOT DETECTED Final   Coronavirus OC43 NOT DETECTED NOT DETECTED Final   Metapneumovirus NOT DETECTED NOT DETECTED Final   Rhinovirus / Enterovirus NOT DETECTED NOT DETECTED Final   Influenza A NOT DETECTED NOT DETECTED Final   Influenza B NOT DETECTED NOT DETECTED Final   Parainfluenza Virus 1 NOT DETECTED NOT DETECTED Final   Parainfluenza Virus 2 NOT DETECTED NOT DETECTED Final   Parainfluenza Virus 3 NOT DETECTED NOT DETECTED Final    Parainfluenza Virus 4 NOT DETECTED NOT DETECTED Final   Respiratory Syncytial Virus NOT DETECTED NOT DETECTED Final   Bordetella pertussis NOT DETECTED NOT DETECTED Final   Bordetella Parapertussis NOT DETECTED NOT DETECTED Final   Chlamydophila pneumoniae NOT DETECTED NOT DETECTED Final   Mycoplasma pneumoniae NOT DETECTED NOT DETECTED Final    Comment: Performed at [facility], [address], [place], [place] [address]      Studies: DG CHEST PORT 1 VIEW  Result Date: 11/19/2020 CLINICAL DATA:  Shortness of breath EXAM: PORTABLE CHEST 1 VIEW COMPARISON:  Radiograph dated November 18, 2020 FINDINGS: The heart size and mediastinal contours are within normal limits. Bilateral lower lobes hazy opacities, unchanged. No large pleural effusion. The visualized skeletal structures are unremarkable. IMPRESSION: Bilateral lower lobe hazy opacities concerning for pneumonia, unchanged. Electronically Signed   By: Larose Hires D.O.   On: 11/19/2020 09:07   VAS [place] LOWER EXTREMITY VENOUS (DVT)  Result Date: 11/19/2020  Lower Venous DVT Study Patient Name:  Felicia Davidson  Date of Exam:   11/19/2020 Medical Rec #: [id number]       Accession #:    [id number] Date of Birth: [birth year]      Patient Gender: F Patient Age:   21 years Exam Location:  [facility] Procedure:      VAS [place] LOWER EXTREMITY VENOUS (DVT) Referring Phys: Karie Fetch --------------------------------------------------------------------------------  Indications: Edema.  Risk Factors: None identified. Limitations: Body habitus and poor ultrasound/tissue interface. Comparison Study: No prior studies. Performing Technologist: Chanda Busing RVT  Examination Guidelines: A complete evaluation includes B-mode imaging, spectral Doppler, color Doppler, and power Doppler as needed of all accessible portions of each vessel. Bilateral testing is considered an integral part of a complete examination. Limited examinations for reoccurring  indications may be performed as noted. The reflux portion of the exam is performed with the patient in reverse Trendelenburg.  +---------+---------------+---------+-----------+----------+-------------------+ RIGHT    CompressibilityPhasicitySpontaneityPropertiesThrombus Aging      +---------+---------------+---------+-----------+----------+-------------------+ CFV      Full           Yes      Yes                                      +---------+---------------+---------+-----------+----------+-------------------+ SFJ      Full                                                             +---------+---------------+---------+-----------+----------+-------------------+  FV Prox  Full                                                             +---------+---------------+---------+-----------+----------+-------------------+ FV Mid   Full                                                             +---------+---------------+---------+-----------+----------+-------------------+ FV Distal               Yes      Yes                                      +---------+---------------+---------+-----------+----------+-------------------+ PFV      Full                                                             +---------+---------------+---------+-----------+----------+-------------------+ POP      Full           Yes      Yes                                      +---------+---------------+---------+-----------+----------+-------------------+ PTV      Full                                                             +---------+---------------+---------+-----------+----------+-------------------+ PERO                                                  Not well visualized +---------+---------------+---------+-----------+----------+-------------------+   +---------+---------------+---------+-----------+----------+--------------+ LEFT      CompressibilityPhasicitySpontaneityPropertiesThrombus Aging +---------+---------------+---------+-----------+----------+--------------+ CFV      Full           Yes      Yes                                 +---------+---------------+---------+-----------+----------+--------------+ SFJ      Full                                                        +---------+---------------+---------+-----------+----------+--------------+ FV Prox  Full                                                        +---------+---------------+---------+-----------+----------+--------------+  FV Mid                  Yes      Yes                                 +---------+---------------+---------+-----------+----------+--------------+ FV Distal               Yes      Yes                                 +---------+---------------+---------+-----------+----------+--------------+ PFV      Full                                                        +---------+---------------+---------+-----------+----------+--------------+ POP      Full           Yes      Yes                                 +---------+---------------+---------+-----------+----------+--------------+ PTV      Full                                                        +---------+---------------+---------+-----------+----------+--------------+ PERO     Full                                                        +---------+---------------+---------+-----------+----------+--------------+    Summary: RIGHT: - There is no evidence of deep vein thrombosis in the lower extremity. However, portions of this examination were limited- see technologist comments above.  - No cystic structure found in the popliteal fossa.  LEFT: - There is no evidence of deep vein thrombosis in the lower extremity. However, portions of this examination were limited- see technologist comments above.  - No cystic structure found in the popliteal  fossa.  *See table(s) above for measurements and observations.    Preliminary     Scheduled Meds:  chlorhexidine  15 mL Mouth Rinse BID   Chlorhexidine Gluconate Cloth  6 each Topical Daily   insulin aspart  0-15 Units Subcutaneous TID WC   insulin aspart  0-5 Units Subcutaneous QHS   mouth rinse  15 mL Mouth Rinse q12n4p    Continuous Infusions:  sodium chloride Stopped (11/18/20 0911)   azithromycin Stopped (11/19/20 1047)   cefTRIAXone (ROCEPHIN)  IV Stopped (11/19/20 1327)     LOS: 1 day     Briant Cedar, MD [facility]  If 7PM-7AM, please contact night-coverage [email or web address] 11/19/2020, 1:40 PM   .

## 2020-11-19 NOTE — Progress Notes (Signed)
Dyspnea/diaphoretic/Increased rhonchi after ambulating to bathroom.  O2 sat 77% on 2 l  RT adm albuterol, placed on non-rebreather.  AOX 4 throughout crisis. Rapid/On call notified.  Transferred  to 1224 at [phone]

## 2020-11-19 NOTE — Consult Note (Signed)
NAMEMirayah Davidson, MRN:  [id number], DOB:  [birth year], LOS: 1 ADMISSION DATE:  11/18/2020, CONSULTATION DATE:  11/19/20 REFERRING MD:  Humberto Leep, CHIEF COMPLAINT:  hypoxia   History of Present Illness:  Felicia Davidson is a 75 y/o woman with SOB and cough who presented to the ED on 11/9. She was diagnosed with pneumonia and started on ceftriaxone and azithromycin at admission. She may have been exposed to someone with the flu prior to admission. She had nasal congestion for several days with progressive DOE over about 4 days before presenting. She has no previous history of lung disease or heart disease. She had mild pedal edema but no CP. No fevers, chills, nausea, vomiting, diarrhea.  She is a former smoker.  This morning she got up to go to the bathroom and desaturated to 77% on 2L [place] and developed increased WOB. She was given lasix since this occurred and she was moved to [place] for BiPAP. She currently feels improved.  Pertinent  Medical History  Anemia Diabetes LE neuropathy  Significant Hospital Events: Including procedures, antibiotic start and stop dates in addition to other pertinent events   11/9 admission, started ceftriaxone, azithromycin 11/10 moved to ICU for hypoxia  Interim History / Subjective:    Objective   Blood pressure (!) 145/45, pulse 78, temperature (!) 96.7 F (35.9 C), temperature source Axillary, resp. rate 17, height 5' 3.5" (1.613 m), weight 92.1 kg, SpO2 100 %.    FiO2 (%):  [100 %] 100 %   Intake/Output Summary (Last 24 hours) at 11/19/2020 1030 Last data filed at 11/19/2020 0300 Gross per 24 hour  Intake 1576.03 ml  Output 1 ml  Net 1575.03 ml   Filed Weights   11/19/20 0012 11/19/20 0738  Weight: 91.1 kg 92.1 kg    Examination: General: elderly woman lying in bed in NAD HENT: [place]/AT, eyes anicteric Lungs: CTAB, breathing comfortably on BiPAP> down to 21% FiO2 Cardiovascular: S1S2, RRR Abdomen: obese, soft, NT Extremities: no clubbing or  cyanosis, no pitting edema.  Neuro: awake, moving all extremities spontaneously Derm: warm, dry. Erythema and tiny, non-raised erythematous macules.   WBC 12.3> 9.0 BNP 134.8 CXR personally reviewed> bilateral lower lobe infiltrates, more patchy in the RLL. Improved compared to admission radiograph.  Resolved Hospital Problem list     Assessment & Plan:   Acute respiratory failure with hypoxia, concern for viral pneumonia with bilateral opacities on CXR. Bacterial pneumonia is also possible. Possibly heart failure with acute pulmonary edema with uncontrolled hypertension, especially during this desaturation episode,  history of pedal edema and elevated BNP. Elevated d-dimer is of concern, but could be related to an acute viral illness.  -Check PCT -Check RVP -Con't empiric CAP antibiotics -BiPAP PRN alternating with nasal cannula. Needs more oxygen when OOB.  -Agree with lasix given elevated BNP. Agree with echocardiogram.  Elevated d-dimer. Wells score 3 (if bilateral edema is sign of PE, otherwise 0) -LE [place] -would defer CT PE at this time sinec I think her oxygen requirement can be better explained by pneumonia.  Rest of care per primary. PCCM will continue to follow.   Best Practice (right click and "Reselect all SmartList Selections" daily)   Per primary  Labs   CBC: Recent Labs  Lab 11/18/20 0847 11/19/20 0509  WBC 12.3* 9.4  NEUTROABS 9.9*  --   HGB 11.8* 9.0*  HCT 37.5 27.7*  MCV 96.4 95.5  PLT 294 222    Basic Metabolic Panel: Recent Labs  Lab  11/18/20 0847 11/18/20 1435 11/19/20 0509  NA 136 139 136  K 5.3* 4.3 4.1  CL 104 105 105  CO2 23 24 26   GLUCOSE 176* 154* 127*  BUN 30* 30* 26*  CREATININE 0.97 0.95 0.99  CALCIUM 9.7 9.5 8.8*  PHOS  --  3.6  --    GFR: Estimated Creatinine Clearance: 54.3 mL/min (by C-G formula based on SCr of 0.99 mg/dL). Recent Labs  Lab 11/18/20 0847 11/18/20 1030 11/19/20 0509 11/19/20 0809  PROCALCITON  --    --   --  0.15  WBC 12.3*  --  9.4  --   LATICACIDVEN  --  0.9  --   --     Liver Function Tests: Recent Labs  Lab 11/18/20 0847 11/18/20 1435 11/19/20 0509  AST 24  --  13*  ALT 12  --  9  ALKPHOS 106  --  72  BILITOT 1.1  --  0.6  PROT 8.3*  --  6.2*  ALBUMIN 4.3 3.9 3.2*   No results for input(s): LIPASE, AMYLASE in the last 168 hours. No results for input(s): AMMONIA in the last 168 hours.  ABG    Component Value Date/Time   PHART 7.375 11/19/2020 0733   PCO2ART 43.0 11/19/2020 0733   PO2ART 104 11/19/2020 0733   HCO3 24.5 11/19/2020 0733   ACIDBASEDEF 0.2 11/19/2020 0733   O2SAT 98.2 11/19/2020 0733     Coagulation Profile: No results for input(s): INR, PROTIME in the last 168 hours.  Cardiac Enzymes: No results for input(s): CKTOTAL, CKMB, CKMBINDEX, TROPONINI in the last 168 hours.  HbA1C: Hgb A1c MFr Bld  Date/Time Value Ref Range Status  11/18/2020 02:30 PM 5.8 (H) 4.8 - 5.6 % Final    Comment:    (NOTE) Pre diabetes:          5.7%-6.4%  Diabetes:              >6.4%  Glycemic control for   <7.0% adults with diabetes     CBG: Recent Labs  Lab 11/18/20 1641 11/18/20 2133 11/19/20 0808  GLUCAP 136* 80 168*    Review of Systems:   Review of Systems  Constitutional:  Negative for chills and fever.  HENT:  Positive for congestion. Negative for sore throat.   Eyes: Negative.   Respiratory:  Positive for shortness of breath and wheezing. Negative for cough.   Cardiovascular:  Positive for leg swelling. Negative for chest pain.  Gastrointestinal:  Negative for heartburn, nausea and vomiting.  Genitourinary: Negative.   Musculoskeletal:  Negative for joint pain and myalgias.  Skin:  Negative for rash.  Neurological:  Negative for dizziness and weakness.  Endo/Heme/Allergies:  Negative for environmental allergies.  Psychiatric/Behavioral: Negative.      Past Medical History:  She,  has a past medical history of Anemia, Diabetes (HCC), and  Neuropathy.   Surgical History:   Past Surgical History:  Procedure Laterality Date   COMBINED HYSTEROSCOPY DIAGNOSTIC / D&C       Social History:   reports that she has quit smoking. She has never used smokeless tobacco. She reports that she does not drink alcohol and does not use drugs.   Family History:  Her family history includes Anemia in her mother; CAD in her father; Lung disease in her maternal grandfather.   Allergies Allergies  Allergen Reactions   Lipitor [Atorvastatin] Other (See Comments)    Sore muscles   Penicillins Other (See Comments)    Stiff joints  Home Medications  Prior to Admission medications   Medication Sig Start Date End Date Taking? Authorizing Provider  amLODipine (NORVASC) 10 MG tablet Take 10 mg by mouth daily. 10/13/20  Yes [provider]  Calcium Carbonate (CALCIUM 600 PO) Take 1 tablet by mouth 2 (two) times daily. With Minerals 2 times daily   Yes [provider]  carvedilol (COREG) 25 MG tablet Take 25 mg by mouth 2 (two) times daily. 10/19/20  Yes [provider]  ferrous sulfate 325 (65 FE) MG tablet Take 325 mg by mouth daily with breakfast.    Yes [provider]  glipiZIDE (GLUCOTROL) 10 MG tablet Take 5 mg by mouth 2 (two) times daily before a meal. 03/18/15  Yes [provider]  metFORMIN (GLUCOPHAGE) 1000 MG tablet Take 1,000 mg by mouth 2 (two) times daily with a meal.   Yes [provider]  Naproxen Sodium 220 MG CAPS Take 2 capsules by mouth 2 (two) times daily with a meal.   Yes [provider]  olmesartan (BENICAR) 40 MG tablet Take 40 mg by mouth every evening. 10/12/20  Yes [provider]  rosuvastatin (CRESTOR) 10 MG tablet Take 10 mg by mouth daily.  03/18/15  Yes [provider]  vitamin B-12 (CYANOCOBALAMIN) 500 MCG tablet Take 500 mcg by mouth daily.   Yes [provider]     Critical care time:      Steffanie Dunn, DO 11/19/20  10:30 AM [facility]

## 2020-11-19 NOTE — Significant Event (Signed)
Rapid Response Event Note   Reason for Call :  Unit charge nurse called to report that patient in respiratory distress after ambulating to bathroom and not recovering as before. Informed that J. Olena Heckle, NP has already put in orders for CXR and Lasix and respiratory therapist has already given nebulizer treatment.  Initial Focused Assessment:  Patient alert, struggling to breathe with 15L NRB mask in place, lungs wet to diminished, and she is diaphoretic. This has been unresolved for at least thirty minutes. Her blood pressure is significantly elevated with systolic in 725'H. No known cardiac history.   Interventions:  Collaborated with bedside staff, unit charge nurse, respiratory therapist, and MD. Determined to quickly move to stepdown unit to utilize BIPAP and closer nursing monitoring. MD putting in orders as nursing team moving patient to room 1224.   Plan of Care:  Informed patient of reason to move to stepdown and she consented. Dr. Horris Latino met patient and nursing staff upon arrival to room 1224. Bedside report given to stepdown RN and care transferred to stepdown team. Dr. Horris Latino gave orders for EKG, ABG, and another dose of Lasix.   Event Summary:   MD Notified: J.Daniels, NP prior to RR call; 0700 Dr. Horris Latino Call Time: [phone] Arrival Time: 4290 End Time: 0715 (brought to 1224)  Selinda Michaels, RN

## 2020-11-20 DIAGNOSIS — J189 Pneumonia, unspecified organism: Secondary | ICD-10-CM | POA: Diagnosis not present

## 2020-11-20 DIAGNOSIS — J9601 Acute respiratory failure with hypoxia: Secondary | ICD-10-CM | POA: Diagnosis not present

## 2020-11-20 DIAGNOSIS — I5033 Acute on chronic diastolic (congestive) heart failure: Secondary | ICD-10-CM

## 2020-11-20 LAB — CBC WITH DIFFERENTIAL/PLATELET
Abs Immature Granulocytes: 0.02 10*3/uL (ref 0.00–0.07)
Basophils Absolute: 0.1 10*3/uL (ref 0.0–0.1)
Basophils Relative: 1 %
Eosinophils Absolute: 0.3 10*3/uL (ref 0.0–0.5)
Eosinophils Relative: 3 %
HCT: 31.4 % — ABNORMAL LOW (ref 36.0–46.0)
Hemoglobin: 10 g/dL — ABNORMAL LOW (ref 12.0–15.0)
Immature Granulocytes: 0 %
Lymphocytes Relative: 28 %
Lymphs Abs: 2.5 10*3/uL (ref 0.7–4.0)
MCH: 30.4 pg (ref 26.0–34.0)
MCHC: 31.8 g/dL (ref 30.0–36.0)
MCV: 95.4 fL (ref 80.0–100.0)
Monocytes Absolute: 1 10*3/uL (ref 0.1–1.0)
Monocytes Relative: 12 %
Neutro Abs: 5.1 10*3/uL (ref 1.7–7.7)
Neutrophils Relative %: 56 %
Platelets: 225 10*3/uL (ref 150–400)
RBC: 3.29 MIL/uL — ABNORMAL LOW (ref 3.87–5.11)
RDW: 13.3 % (ref 11.5–15.5)
WBC: 9 10*3/uL (ref 4.0–10.5)
nRBC: 0 % (ref 0.0–0.2)

## 2020-11-20 LAB — BASIC METABOLIC PANEL
Anion gap: 10 (ref 5–15)
BUN: 27 mg/dL — ABNORMAL HIGH (ref 8–23)
CO2: 27 mmol/L (ref 22–32)
Calcium: 9.1 mg/dL (ref 8.9–10.3)
Chloride: 104 mmol/L (ref 98–111)
Creatinine, Ser: 1.12 mg/dL — ABNORMAL HIGH (ref 0.44–1.00)
GFR, Estimated: 52 mL/min — ABNORMAL LOW (ref >60)
Glucose, Bld: 105 mg/dL — ABNORMAL HIGH (ref 70–99)
Potassium: 3.8 mmol/L (ref 3.5–5.1)
Sodium: 141 mmol/L (ref 135–145)

## 2020-11-20 LAB — IRON AND TIBC
Iron: 33 ug/dL (ref 28–170)
Saturation Ratios: 10 % — ABNORMAL LOW (ref 10.4–31.8)
TIBC: 332 ug/dL (ref 250–450)
UIBC: 299 ug/dL

## 2020-11-20 LAB — FOLATE: Folate: 9.5 ng/mL (ref >5.9)

## 2020-11-20 LAB — LEGIONELLA PNEUMOPHILA SEROGP 1 UR AG: L. pneumophila Serogp 1 Ur Ag: NEGATIVE

## 2020-11-20 LAB — GLUCOSE, CAPILLARY
Glucose-Capillary: 123 mg/dL — ABNORMAL HIGH (ref 70–99)
Glucose-Capillary: 163 mg/dL — ABNORMAL HIGH (ref 70–99)
Glucose-Capillary: 106 mg/dL — ABNORMAL HIGH (ref 70–99)
Glucose-Capillary: 135 mg/dL — ABNORMAL HIGH (ref 70–99)

## 2020-11-20 LAB — VITAMIN B12: Vitamin B-12: 392 pg/mL (ref 180–914)

## 2020-11-20 LAB — FERRITIN: Ferritin: 67 ng/mL (ref 11–307)

## 2020-11-20 LAB — PROCALCITONIN: Procalcitonin: 0.39 ng/mL

## 2020-11-20 MED ORDER — ONDANSETRON HCL 4 MG/2ML IJ SOLN
4.0000 mg | Freq: Three times a day (TID) | INTRAMUSCULAR | Status: DC | PRN
  Filled 2020-11-20: qty 2

## 2020-11-20 MED ORDER — AMLODIPINE BESYLATE 10 MG PO TABS
10.0000 mg | ORAL_TABLET | Freq: Every day | ORAL | Status: DC
  Administered 2020-11-20 – 2020-11-22 (×3): 10 mg via ORAL
  Filled 2020-11-20 (×3): qty 1

## 2020-11-20 MED ORDER — FUROSEMIDE 10 MG/ML IJ SOLN
40.0000 mg | Freq: Once | INTRAMUSCULAR | Status: AC
  Administered 2020-11-20: 40 mg via INTRAVENOUS
  Filled 2020-11-20: qty 4

## 2020-11-20 MED ORDER — CYANOCOBALAMIN 500 MCG PO TABS
500.0000 ug | ORAL_TABLET | Freq: Every day | ORAL | Status: DC
  Administered 2020-11-21 – 2020-11-22 (×2): 500 ug via ORAL
  Filled 2020-11-20 (×4): qty 1

## 2020-11-20 MED ORDER — CARVEDILOL 12.5 MG PO TABS
25.0000 mg | ORAL_TABLET | Freq: Two times a day (BID) | ORAL | Status: DC
  Administered 2020-11-20: 25 mg via ORAL
  Filled 2020-11-20: qty 2

## 2020-11-20 MED ORDER — FERROUS SULFATE 325 (65 FE) MG PO TABS
325.0000 mg | ORAL_TABLET | Freq: Every day | ORAL | Status: DC
  Administered 2020-11-21 – 2020-11-22 (×2): 325 mg via ORAL
  Filled 2020-11-20 (×2): qty 1

## 2020-11-20 NOTE — Progress Notes (Addendum)
NAMEMyrical Davidson, MRN:  [id number], DOB:  [birth year], LOS: 2 ADMISSION DATE:  11/18/2020, CONSULTATION DATE:  11/19/20 REFERRING MD:  Humberto Leep, CHIEF COMPLAINT:  hypoxia   History of Present Illness:  Ms. Felicia Davidson is a 75 y/o woman with SOB and cough who presented to the ED on 11/9. She was diagnosed with pneumonia and started on ceftriaxone and azithromycin at admission. She may have been exposed to someone with the flu prior to admission. She had nasal congestion for several days with progressive DOE over about 4 days before presenting. She has no previous history of lung disease or heart disease. She had mild pedal edema but no CP. No fevers, chills, nausea, vomiting, diarrhea.  She is a former smoker.  This morning she got up to go to the bathroom and desaturated to 77% on 2L [place] and developed increased WOB. She was given lasix since this occurred and she was moved to [place] for BiPAP. She currently feels improved.  Pertinent  Medical History  Anemia Diabetes LE neuropathy  Significant Hospital Events: Including procedures, antibiotic start and stop dates in addition to other pertinent events   11/9 admission, started ceftriaxone, azithromycin 11/10 moved to ICU for hypoxia  Interim History / Subjective:  Feeling improved since yesterday. No new complaints.   Objective   Blood pressure (!) 145/46, pulse 64, temperature 98.3 F (36.8 C), temperature source Oral, resp. rate 17, height 5' 3.5" (1.613 m), weight 92.1 kg, SpO2 98 %.        Intake/Output Summary (Last 24 hours) at 11/20/2020 1213 Last data filed at 11/20/2020 0846 Gross per 24 hour  Intake 341.95 ml  Output 3850 ml  Net -3508.05 ml   Filed Weights   11/19/20 0012 11/19/20 0738  Weight: 91.1 kg 92.1 kg    Examination: General: Elderly woman sitting up in bed no acute distress HENT: [place]/AT, eyes anicteric Lungs: Breathing comfortably on 3 L nasal cannula saturating upper 90s, faint rales cleared with deep  breathing the bases. Cardiovascular: S1-S2, regular rate and rhythm Abdomen: Obese, soft, nondistended Extremities: No pedal edema, no cyanosis Neuro: Awake, alert, answering questions appropriately Derm: Warm, dry, rash similar on right hand  WBC 9.0 PCT 0.39 Blood cultures-no growth x2 days Lower extremity ultrasound-no DVT bilaterally Echocardiogram-LVEF 60 to 65%, mild concentric LVH with grade 1 diastolic dysfunction.  Normal RV and normal atria bilaterally.  Normal IVC variability.  Resolved Hospital Problem list     Assessment & Plan:   Acute respiratory failure with hypoxia, concern for viral pneumonia with bilateral opacities on CXR and recent viral prodrome, but respiratory viral panel negative. Bacterial pneumonia is also possible-  Procalcitonin is currently indeterminate.  Possibly heart failure with acute pulmonary edema with uncontrolled hypertension, especially during her major desaturation episode,  history of pedal edema and elevated BNP.  -Okay to continue antibiotics empirically to complete a 5-day course - Titrate down supplemental oxygen as able to maintain SPO2 greater than 90% -recommend additional lasix (ordered) - Out of bed mobility, determine if she still has increased oxygen saturations when walking.  If not, I suspect that her improvement is due to her significant response to Lasix yesterday.   Rest of care per primary. Please call over the weekend with questions.   Best Practice (right click and "Reselect all SmartList Selections" daily)   Per primary  Labs   CBC: Recent Labs  Lab 11/18/20 0847 11/19/20 0509 11/20/20 0235  WBC 12.3* 9.4 9.0  NEUTROABS 9.9*  --  5.1  HGB 11.8* 9.0* 10.0*  HCT 37.5 27.7* 31.4*  MCV 96.4 95.5 95.4  PLT 294 222 225     Basic Metabolic Panel: Recent Labs  Lab 11/18/20 0847 11/18/20 1435 11/19/20 0509 11/20/20 0235  NA 136 139 136 141  K 5.3* 4.3 4.1 3.8  CL 104 105 105 104  CO2 23 24 26 27   GLUCOSE  176* 154* 127* 105*  BUN 30* 30* 26* 27*  CREATININE 0.97 0.95 0.99 1.12*  CALCIUM 9.7 9.5 8.8* 9.1  PHOS  --  3.6  --   --     GFR: Estimated Creatinine Clearance: 48 mL/min (A) (by C-G formula based on SCr of 1.12 mg/dL (H)). Recent Labs  Lab 11/18/20 0847 11/18/20 1030 11/19/20 0509 11/19/20 0809 11/20/20 0235  PROCALCITON  --   --   --  0.15 0.39  WBC 12.3*  --  9.4  --  9.0  LATICACIDVEN  --  0.9  --   --   --      13/11/22, DO 11/20/20 12:19 PM  [facility] Care

## 2020-11-20 NOTE — Progress Notes (Signed)
PROGRESS NOTE  Shatha Hooser XBJ:[id number] DOB: [birth year] DOA: 11/18/2020 PCP: Kaleen Mask, MD  HPI/Recap of past 24 hours: Felicia Davidson is a 75 y.o. female with medical history significant of DM2, HTN, HLD. Presenting with worsening dyspnea and generalized weakness that started 4 days ago. She noticed that her regular activities were taking her a longer time to complete because of dyspnea. Denied any cough or fever. Due to worsening symptoms, pt presented to the ED. In the ED, pt noted to be tachypneic, hypoxic, requiring about 4 L of O2, with BP around 174/62.  Labs showed WBC 12.3, potassium 5.3, BNP 134.8, otherwise unremarkable.  Chest x-ray showed bilateral lower lobe hazy opacities concerning for pneumonia.  Patient admitted for further management.    Today, pt reported breathing is improving, no longer requiring bipap, weaning to RA. Pt denies any worsening SOB, no chest pain, abdominal pain, fever/chills. Husband at bedside.      Assessment/Plan: Active Problems:   PNA (pneumonia)   Acute respiratory failure with hypoxia BLL CAP Acute/flash pulmonary edema Currently on weaning to RA ABG on BiPAP unremarkable Currently afebrile, with no leukocytosis Chest x-ray with noted bilateral infiltrates Procalcitonin 0.15,-->0.39 COVID, flu negative Respiratory viral panel negative Continue azithromycin, Rocephin Received IV Lasix 40 mg, further dosing dependent on clinical status Duo nebs, cough suppressants as needed Supplemental O2 as needed Monitor closely  ?Acute on chronic diastolic HF 2+ bilateral lower extremity edema ECHO showed EF 60-65%, no RWMA, Grade 1DD Prn lasix pending clinical status  Elevated D-dimer D-dimer 3.89, likely elevated due to PNA DVT Doppler BLE, negative for DVT Hold off CTA chest due to significant improvement  Hypertension Uncontrolled Restart home Norvasc, Coreg Hold Benicar due to mild bump in Cr IV hydralazine as  needed  Diabetes mellitus type 2, controlled Last A1c 5.8 SSI, Accu-Cheks, hypoglycemic protocol Hold home metformin, glipizide  Normocytic/deficiency anemia Hemoglobin stable Anemia panel with sats 10, otherwise unremarkable  Continue oral iron and Vit B12 supplementation Daily CBC  Hyperlipidemia Hold home Crestor for now  Obesity Lifestyle modification advised   Estimated body mass index is 35.4 kg/m as calculated from the following:   Height as of this encounter: 5' 3.5" (1.613 m).   Weight as of this encounter: 92.1 kg.     Code Status: Full  Family Communication: Husband at bedside  Disposition Plan: Status is: Inpatient  Remains inpatient appropriate because: Level of care    Consultants: PCCM  Procedures: None  Antimicrobials: Azithromycin Ceftriaxone  DVT prophylaxis: Lovenox     Objective: Vitals:   11/20/20 1100 11/20/20 1200 11/20/20 1300 11/20/20 1600  BP: (!) 146/53 (!) 143/48 (!) 161/101   Pulse: 77 79 82   Resp: 18 19 (!) 23   Temp:  98.6 F (37 C)  98.1 F (36.7 C)  TempSrc:  Oral  Oral  SpO2: 99% 98% 98%   Weight:      Height:        Intake/Output Summary (Last 24 hours) at 11/20/2020 1829 Last data filed at 11/20/2020 0846 Gross per 24 hour  Intake --  Output 1550 ml  Net -1550 ml   Filed Weights   11/19/20 0012 11/19/20 0738  Weight: 91.1 kg 92.1 kg    Exam: General: NAD Cardiovascular: S1, S2 present Respiratory: Diminished BS b/l with some bibasilar crackles  Abdomen: Soft, nontender, nondistended, bowel sounds present Musculoskeletal: Trace bilateral pedal edema noted Skin: Normal Psychiatry: Normal mood     Data Reviewed: CBC: Recent  Labs  Lab 11/18/20 0847 11/19/20 0509 11/20/20 0235  WBC 12.3* 9.4 9.0  NEUTROABS 9.9*  --  5.1  HGB 11.8* 9.0* 10.0*  HCT 37.5 27.7* 31.4*  MCV 96.4 95.5 95.4  PLT 294 222 225   Basic Metabolic Panel: Recent Labs  Lab 11/18/20 0847 11/18/20 1435  11/19/20 0509 11/20/20 0235  NA 136 139 136 141  K 5.3* 4.3 4.1 3.8  CL 104 105 105 104  CO2 23 24 26 27   GLUCOSE 176* 154* 127* 105*  BUN 30* 30* 26* 27*  CREATININE 0.97 0.95 0.99 1.12*  CALCIUM 9.7 9.5 8.8* 9.1  PHOS  --  3.6  --   --    GFR: Estimated Creatinine Clearance: 48 mL/min (A) (by C-G formula based on SCr of 1.12 mg/dL (H)). Liver Function Tests: Recent Labs  Lab 11/18/20 0847 11/18/20 1435 11/19/20 0509  AST 24  --  13*  ALT 12  --  9  ALKPHOS 106  --  72  BILITOT 1.1  --  0.6  PROT 8.3*  --  6.2*  ALBUMIN 4.3 3.9 3.2*   No results for input(s): LIPASE, AMYLASE in the last 168 hours. No results for input(s): AMMONIA in the last 168 hours. Coagulation Profile: No results for input(s): INR, PROTIME in the last 168 hours. Cardiac Enzymes: No results for input(s): CKTOTAL, CKMB, CKMBINDEX, TROPONINI in the last 168 hours. BNP (last 3 results) No results for input(s): PROBNP in the last 8760 hours. HbA1C: Recent Labs    11/18/20 1430  HGBA1C 5.8*   CBG: Recent Labs  Lab 11/19/20 1659 11/19/20 2147 11/20/20 0831 11/20/20 1107 11/20/20 1733  GLUCAP 135* 119* 123* 163* 106*   Lipid Profile: No results for input(s): CHOL, HDL, LDLCALC, TRIG, CHOLHDL, LDLDIRECT in the last 72 hours. Thyroid Function Tests: No results for input(s): TSH, T4TOTAL, FREET4, T3FREE, THYROIDAB in the last 72 hours. Anemia Panel: Recent Labs    11/20/20 0235  VITAMINB12 392  FOLATE 9.5  FERRITIN 67  TIBC 332  IRON 33   Urine analysis: No results found for: COLORURINE, APPEARANCEUR, LABSPEC, PHURINE, GLUCOSEU, HGBUR, BILIRUBINUR, KETONESUR, PROTEINUR, UROBILINOGEN, NITRITE, LEUKOCYTESUR Sepsis Labs: @LABRCNTIP (procalcitonin:4,lacticidven:4)  ) Recent Results (from the past 240 hour(s))  Resp Panel by RT-PCR (Flu A&B, Covid) Nasopharyngeal Swab     Status: None   Collection Time: 11/18/20  8:47 AM   Specimen: Nasopharyngeal Swab; Nasopharyngeal(NP) swabs in vial  transport medium  Result Value Ref Range Status   SARS Coronavirus 2 by RT PCR NEGATIVE NEGATIVE Final    Comment: (NOTE) SARS-CoV-2 target nucleic acids are NOT DETECTED.  The SARS-CoV-2 RNA is generally detectable in upper respiratory specimens during the acute phase of infection. The lowest concentration of SARS-CoV-2 viral copies this assay can detect is 138 copies/mL. A negative result does not preclude SARS-Cov-2 infection and should not be used as the sole basis for treatment or other patient management decisions. A negative result may occur with  improper specimen collection/handling, submission of specimen other than nasopharyngeal swab, presence of viral mutation(s) within the areas targeted by this assay, and inadequate number of viral copies(<138 copies/mL). A negative result must be combined with clinical observations, patient history, and epidemiological information. The expected result is Negative.  Fact Sheet for Patients:   Fact Sheet for Healthcare Providers:  13/09/22  This test is no t yet approved or cleared by the [employer] FDA and  has been authorized for detection and/or diagnosis of SARS-CoV-2 by FDA  under an Emergency Use Authorization (EUA). This EUA will remain  in effect (meaning this test can be used) for the duration of the COVID-19 declaration under Section 564(b)(1) of the Act, 21 U.S.C.section 360bbb-3(b)(1), unless the authorization is terminated  or revoked sooner.       Influenza A by PCR NEGATIVE NEGATIVE Final   Influenza B by PCR NEGATIVE NEGATIVE Final    Comment: (NOTE) The Xpert Xpress SARS-CoV-2/FLU/RSV plus assay is intended as an aid in the diagnosis of influenza from Nasopharyngeal swab specimens and should not be used as a sole basis for treatment. Nasal washings and aspirates are unacceptable for Xpert Xpress SARS-CoV-2/FLU/RSV testing.  Fact  Sheet for Patients: [employer]  Fact Sheet for Healthcare Providers: [employer]ious[employer]ker[employer]it  This test is not yet approved or cleared by the [place] FDA and has been authorized for detection and/or diagnosis of SARS-CoV-2 by FDA under an Emergency Use Authorization (EUA). This EUA will remain in effect (meaning this test can be used) for the duration of the COVID-19 declaration under Section 564(b)(1) of the Act, 21 U.S.C. section 360bbb-3(b)(1), unless the authorization is terminated or revoked.  Performed at [facility], [address] [place], [place] [address]   Culture, blood (Routine X 2) w Reflex to ID Panel     Status: None (Preliminary result)   Collection Time: 11/18/20 10:13 AM   Specimen: BLOOD  Result Value Ref Range Status   Specimen Description   Final    BLOOD BLOOD RIGHT HAND Performed at [facility], [address], [place], [place] [address]    Special Requests   Final    BOTTLES DRAWN AEROBIC ONLY Blood Culture adequate volume Performed at [facility], [address] [place], [place] [address]    Culture   Final    NO GROWTH 2 DAYS Performed at [facility], [address], [place], [place] [address]    Report Status PENDING  Incomplete  Culture, blood (Routine X 2) w Reflex to ID Panel     Status: None (Preliminary result)   Collection Time: 11/18/20 10:18 AM   Specimen: BLOOD  Result Value Ref Range Status   Specimen Description   Final    BLOOD RIGHT ANTECUBITAL Performed at [facility], [address], [place], [place] [address]    Special Requests   Final    BOTTLES DRAWN AEROBIC AND ANAEROBIC Blood Culture adequate volume Performed at [facility], [address] [place], [place] [address]    Culture   Final    NO GROWTH 2 DAYS Performed at [facility], [address],  [place], [place] [address]    Report Status PENDING  Incomplete  MRSA Next Gen by PCR, Nasal     Status: None   Collection Time: 11/19/20  7:38 AM   Specimen: Nasal Mucosa; Nasal Swab  Result Value Ref Range Status   MRSA by PCR Next Gen NOT DETECTED NOT DETECTED Final    Comment: (NOTE) The GeneXpert MRSA Assay (FDA approved for NASAL specimens only), is one component of a comprehensive MRSA colonization surveillance program. It is not intended to diagnose MRSA infection nor to guide or monitor treatment for MRSA infections. Test performance is not FDA approved in patients less than 58 years old. Performed at [facility], [address], [place], [place] [address]   Respiratory (~20 pathogens) panel by PCR     Status: None  Collection Time: 11/19/20  9:56 AM   Specimen: Nasopharyngeal Swab; Respiratory  Result Value Ref Range Status   Adenovirus NOT DETECTED NOT DETECTED Final   Coronavirus 229E NOT DETECTED NOT DETECTED Final    Comment: (NOTE) The Coronavirus on the Respiratory Panel, DOES NOT test for the novel  Coronavirus (2019 nCoV)    Coronavirus HKU1 NOT DETECTED NOT DETECTED Final   Coronavirus NL63 NOT DETECTED NOT DETECTED Final   Coronavirus OC43 NOT DETECTED NOT DETECTED Final   Metapneumovirus NOT DETECTED NOT DETECTED Final   Rhinovirus / Enterovirus NOT DETECTED NOT DETECTED Final   Influenza A NOT DETECTED NOT DETECTED Final   Influenza B NOT DETECTED NOT DETECTED Final   Parainfluenza Virus 1 NOT DETECTED NOT DETECTED Final   Parainfluenza Virus 2 NOT DETECTED NOT DETECTED Final   Parainfluenza Virus 3 NOT DETECTED NOT DETECTED Final   Parainfluenza Virus 4 NOT DETECTED NOT DETECTED Final   Respiratory Syncytial Virus NOT DETECTED NOT DETECTED Final   Bordetella pertussis NOT DETECTED NOT DETECTED Final   Bordetella Parapertussis NOT DETECTED NOT DETECTED Final   Chlamydophila pneumoniae NOT DETECTED NOT DETECTED Final   Mycoplasma  pneumoniae NOT DETECTED NOT DETECTED Final    Comment: Performed at [facility], [address] [place], [place] [address]      Studies: No results found.  Scheduled Meds:  chlorhexidine  15 mL Mouth Rinse BID   Chlorhexidine Gluconate Cloth  6 each Topical Daily   enoxaparin (LOVENOX) injection  40 mg Subcutaneous Q24H   furosemide  40 mg Intravenous Once   insulin aspart  0-15 Units Subcutaneous TID WC   insulin aspart  0-5 Units Subcutaneous QHS   mouth rinse  15 mL Mouth Rinse q12n4p    Continuous Infusions:  sodium chloride Stopped (11/18/20 0911)   azithromycin Stopped (11/20/20 1217)   cefTRIAXone (ROCEPHIN)  IV Stopped (11/20/20 1049)     LOS: 2 days     Briant Cedar, MD [facility]  If 7PM-7AM, please contact night-coverage [email or web address] 11/20/2020, 6:29 PM   .

## 2020-11-20 NOTE — Evaluation (Signed)
Occupational Therapy Evaluation Patient Details Name: Felicia Davidson MRN: [id number] DOB: 08-31-1945 Today's Date: 11/20/2020   History of Present Illness Felicia Davidson is a 75 y/o woman with SOB and cough who presented to the ED on 11/9. She was diagnosed with pneumonia   Clinical Impression   Felicia Davidson is a 75 year old woman who lives at home with her husband and is typically independent with ADLs and household tasks. 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 Patient able to don socks in seated position and demonstrated the physical ability to stand and don lower body clothing. Patient found on 2 L [place] at 98%. [place] 95-96% on RA and dropped to 91% with in room ambulation. Recovered back to 96% in sitting position. Patient reports being near her baseline except for her breathing needing "more time." Patient appears to be near her baseline in regards to functional abilities. Patient and husband asked questions in regards to walking the driveway that wasn't paved and with a slight grade. Therapist recommended a flat sidewalk at a park for initial ambulation due to patient's impaired balance and endurance. Will defer to PT for further recommendations.     Recommendations for follow up therapy are one component of a multi-disciplinary discharge planning process, led by the attending physician.  Recommendations may be updated based on patient status, additional functional criteria and insurance authorization.   Follow Up Recommendations  No OT follow up    Assistance Recommended at Discharge None  Functional Status Assessment  Patient has had a recent decline in their functional status and demonstrates the ability to make significant improvements in function in a reasonable and predictable amount of time.  Equipment Recommendations  None recommended by OT    Recommendations for  Other Services       Precautions / Restrictions Precautions Precautions: Other (comment);Fall Precaution Comments: monitor sat Restrictions Weight Bearing Restrictions: No      Mobility Bed Mobility               General bed mobility comments: up in chair    Transfers Overall transfer level: Needs assistance Equipment used: None Transfers: Sit to/from Stand Sit to Stand: Min guard           General transfer comment: min guard to ambulate in room with patient using hand holds on furniture. She reports she does this at home. o2 sat 91% and above on RA      Balance Overall balance assessment: Mild deficits observed, not formally tested                                         ADL either performed or assessed with clinical judgement   ADL Overall ADL's : At baseline                                       General ADL Comments: Patient demonstrates the abiility to perform ADLs.     Vision Patient Visual Report: No change from baseline       Perception     Praxis      Pertinent Vitals/Pain Pain Assessment: No/denies pain     Hand Dominance     Extremity/Trunk Assessment Upper Extremity Assessment  Upper Extremity Assessment: RUE deficits/detail;LUE deficits/detail RUE Deficits / Details: WFL ROM, 3+/5 shoulder strength (hx of OA and chronic problem) otherwise 4+/5 strength, arthritic changes in right hand but normal grip strength. hand currently red from medication reaction. RUE Sensation: WNL RUE Coordination: WNL LUE Deficits / Details: WFL ROM, 4+/5 strength throughout LUE Sensation: WNL LUE Coordination: WNL   Lower Extremity Assessment Lower Extremity Assessment: Defer to PT evaluation   Cervical / Trunk Assessment Cervical / Trunk Assessment: Normal   Communication Communication Communication: No difficulties   Cognition Arousal/Alertness: Awake/alert Behavior During Therapy: WFL for tasks  assessed/performed Overall Cognitive Status: Within Functional Limits for tasks assessed                                       General Comments       Exercises     Shoulder Instructions      Home Living Family/patient expects to be discharged to:: Private residence Living Arrangements: Spouse/significant other Available Help at Discharge: Family;Available 24 hours/day Type of Home: House Home Access: Stairs to enter [employer] of Steps: 4   Home Layout: One level     Bathroom Shower/Tub: Producer, television/film/video: Standard     Home Equipment: Agricultural consultant (2 wheels);Cane - single point;Shower seat          Prior Functioning/Environment Prior Level of Function : Independent/Modified Independent             Mobility Comments: furniture surfs ADLs Comments: independent        OT Problem List: Impaired balance (sitting and/or standing);Cardiopulmonary status limiting activity      OT Treatment/Interventions:      OT Goals(Current goals can be found in the care plan section) Acute Rehab OT Goals OT Goal Formulation: All assessment and education complete, DC therapy  OT Frequency:     Barriers to D/C:            Co-evaluation              AM-PAC OT "6 Clicks" Daily Activity     Outcome Measure Help from another person eating meals?: None Help from another person taking care of personal grooming?: None Help from another person toileting, which includes using toliet, bedpan, or urinal?: None Help from another person bathing (including washing, rinsing, drying)?: None Help from another person to put on and taking off regular upper body clothing?: None Help from another person to put on and taking off regular lower body clothing?: None 6 Click Score: 24   End of Session Equipment Utilized During Treatment: Oxygen Nurse Communication:  (oxygen with walking)  Activity Tolerance: Patient tolerated treatment  well Patient left: in chair;with call bell/phone within reach;with family/visitor present  OT Visit Diagnosis: Muscle weakness (generalized) (M62.81);Unsteadiness on feet (R26.81)                Time: 7341-9379 OT Time Calculation (min): 17 min Charges:  OT General Charges $OT Visit: 1 Visit OT Evaluation $OT Eval Low Complexity: 1 Low  Ophia Shamoon, OTR/L Acute Care Rehab Services  Office [phone] Pager: [phone]   Kelli Churn 11/20/2020, 3:07 PM

## 2020-11-21 LAB — GLUCOSE, CAPILLARY
Glucose-Capillary: 114 mg/dL — ABNORMAL HIGH (ref 70–99)
Glucose-Capillary: 213 mg/dL — ABNORMAL HIGH (ref 70–99)
Glucose-Capillary: 106 mg/dL — ABNORMAL HIGH (ref 70–99)
Glucose-Capillary: 121 mg/dL — ABNORMAL HIGH (ref 70–99)

## 2020-11-21 LAB — CBC WITH DIFFERENTIAL/PLATELET
Abs Immature Granulocytes: 0.02 10*3/uL (ref 0.00–0.07)
Basophils Absolute: 0.1 10*3/uL (ref 0.0–0.1)
Basophils Relative: 1 %
Eosinophils Absolute: 0.3 10*3/uL (ref 0.0–0.5)
Eosinophils Relative: 4 %
HCT: 28.5 % — ABNORMAL LOW (ref 36.0–46.0)
Hemoglobin: 9.4 g/dL — ABNORMAL LOW (ref 12.0–15.0)
Immature Granulocytes: 0 %
Lymphocytes Relative: 27 %
Lymphs Abs: 2.2 10*3/uL (ref 0.7–4.0)
MCH: 30.9 pg (ref 26.0–34.0)
MCHC: 33 g/dL (ref 30.0–36.0)
MCV: 93.8 fL (ref 80.0–100.0)
Monocytes Absolute: 1.1 10*3/uL — ABNORMAL HIGH (ref 0.1–1.0)
Monocytes Relative: 13 %
Neutro Abs: 4.6 10*3/uL (ref 1.7–7.7)
Neutrophils Relative %: 55 %
Platelets: 230 10*3/uL (ref 150–400)
RBC: 3.04 MIL/uL — ABNORMAL LOW (ref 3.87–5.11)
RDW: 13.2 % (ref 11.5–15.5)
WBC: 8.3 10*3/uL (ref 4.0–10.5)
nRBC: 0 % (ref 0.0–0.2)

## 2020-11-21 LAB — BASIC METABOLIC PANEL
Anion gap: 10 (ref 5–15)
BUN: 33 mg/dL — ABNORMAL HIGH (ref 8–23)
CO2: 27 mmol/L (ref 22–32)
Calcium: 8.9 mg/dL (ref 8.9–10.3)
Chloride: 100 mmol/L (ref 98–111)
Creatinine, Ser: 1.17 mg/dL — ABNORMAL HIGH (ref 0.44–1.00)
GFR, Estimated: 49 mL/min — ABNORMAL LOW (ref >60)
Glucose, Bld: 118 mg/dL — ABNORMAL HIGH (ref 70–99)
Potassium: 3.7 mmol/L (ref 3.5–5.1)
Sodium: 137 mmol/L (ref 135–145)

## 2020-11-21 LAB — PROCALCITONIN: Procalcitonin: 0.46 ng/mL

## 2020-11-21 MED ORDER — CARVEDILOL 12.5 MG PO TABS
12.5000 mg | ORAL_TABLET | Freq: Two times a day (BID) | ORAL | Status: DC
  Administered 2020-11-21 – 2020-11-22 (×3): 12.5 mg via ORAL
  Filled 2020-11-21 (×3): qty 1

## 2020-11-21 NOTE — Evaluation (Signed)
Physical Therapy Evaluation Patient Details Name: Felicia Davidson MRN: [id number] DOB: 02-17-1945 Today's Date: 11/21/2020  History of Present Illness  Felicia Davidson is a 74 y/o woman with SOB and cough who presented to the ED on 11/9. She was diagnosed with pneumonia  Clinical Impression  Pt admitted as above and presenting with functional mobility limitations 2* generalized weakness and ambulatory balance deficits.  Pt should progress to dc home with family assist.     Recommendations for follow up therapy are one component of a multi-disciplinary discharge planning process, led by the attending physician.  Recommendations may be updated based on patient status, additional functional criteria and insurance authorization.  Follow Up Recommendations No PT follow up    Assistance Recommended at Discharge Intermittent Supervision/Assistance  Functional Status Assessment Patient has had a recent decline in their functional status and demonstrates the ability to make significant improvements in function in a reasonable and predictable amount of time.  Equipment Recommendations  Other (comment) (TBD - spouse is checking walker at home to see if it has wheels)    Recommendations for Other Services       Precautions / Restrictions Precautions Precautions: Fall Precaution Comments: monitor sat Restrictions Weight Bearing Restrictions: No      Mobility  Bed Mobility               General bed mobility comments: up in chair    Transfers Overall transfer level: Needs assistance Equipment used: Rolling walker (2 wheels) Transfers: Sit to/from Stand Sit to Stand: Min guard           General transfer comment: min cues for use of UEs to self assist    Ambulation/Gait Ambulation/Gait assistance: Min guard Gait Distance (Feet): 400 Feet Assistive device: Rolling walker (2 wheels) Gait Pattern/deviations: Step-through pattern;Shuffle;Trunk flexed Gait velocity: Moderate pace      General Gait Details: cues for posture and position from RW; pt maintained O2 sats above 90% on RA  Stairs            Wheelchair Mobility    Modified Rankin (Stroke Patients Only)       Balance Overall balance assessment: Needs assistance Sitting-balance support: No upper extremity supported;Feet supported Sitting balance-Leahy Scale: Good     Standing balance support: No upper extremity supported Standing balance-Leahy Scale: Fair                               Pertinent Vitals/Pain Pain Assessment: No/denies pain    Home Living Family/patient expects to be discharged to:: Private residence Living Arrangements: Spouse/significant other Available Help at Discharge: Family;Available 24 hours/day Type of Home: House Home Access: Stairs to enter Entrance Stairs-Rails: Right Entrance Stairs-Number of Steps: 4   Home Layout: One level Home Equipment: Agricultural consultant (2 wheels);Cane - single point;Shower seat      Prior Function Prior Level of Function : Independent/Modified Independent             Mobility Comments: furniture surfs ADLs Comments: independent     Hand Dominance        Extremity/Trunk Assessment   Upper Extremity Assessment Upper Extremity Assessment: Defer to OT evaluation    Lower Extremity Assessment Lower Extremity Assessment: Generalized weakness    Cervical / Trunk Assessment Cervical / Trunk Assessment: Normal  Communication   Communication: No difficulties  Cognition Arousal/Alertness: Awake/alert Behavior During Therapy: WFL for tasks assessed/performed Overall Cognitive Status: Within Functional Limits for tasks  assessed                                          General Comments      Exercises     Assessment/Plan    PT Assessment Patient needs continued PT services  PT Problem List Decreased strength;Decreased balance;Decreased activity tolerance;Decreased mobility;Decreased  knowledge of use of DME       PT Treatment Interventions DME instruction;Gait training;Stair training;Functional mobility training;Therapeutic activities;Therapeutic exercise;Patient/family education    PT Goals (Current goals can be found in the Care Plan section)  Acute Rehab PT Goals Patient Stated Goal: Resume previous lifestyle PT Goal Formulation: With patient Time For Goal Achievement: 12/05/20 Potential to Achieve Goals: Good    Frequency Min 3X/week   Barriers to discharge        Co-evaluation               AM-PAC PT "6 Clicks" Mobility  Outcome Measure Help needed turning from your back to your side while in a flat bed without using bedrails?: None Help needed moving from lying on your back to sitting on the side of a flat bed without using bedrails?: None Help needed moving to and from a bed to a chair (including a wheelchair)?: A Little Help needed standing up from a chair using your arms (e.g., wheelchair or bedside chair)?: A Little Help needed to walk in hospital room?: A Little Help needed climbing 3-5 steps with a railing? : A Little 6 Click Score: 20    End of Session Equipment Utilized During Treatment: Gait belt Activity Tolerance: Patient tolerated treatment well Patient left: in chair;with call bell/phone within reach;with family/visitor present Nurse Communication: Mobility status PT Visit Diagnosis: Difficulty in walking, not elsewhere classified (R26.2);Muscle weakness (generalized) (M62.81)    Time: 6606-3016 PT Time Calculation (min) (ACUTE ONLY): 24 min   Charges:   PT Evaluation $PT Eval Low Complexity: 1 Low PT Treatments $Gait Training: 8-22 mins        Mauro Kaufmann PT Acute Rehabilitation Services Pager [phone] Office [phone]   Felicia Davidson 11/21/2020, 12:28 PM

## 2020-11-21 NOTE — Progress Notes (Signed)
Pt states she doesn't wear any cpap or bipap at home. Pt is not in any distress, BIPAP order PRN and not needed at this time.

## 2020-11-21 NOTE — Progress Notes (Signed)
PROGRESS NOTE  Felicia Davidson SVX:[id number] DOB: October 09, 1945 DOA: 11/18/2020 PCP: Kaleen Mask, MD  HPI/Recap of past 24 hours: Felicia Davidson is a 75 y.o. female with medical history significant of DM2, HTN, HLD. Presenting with worsening dyspnea and generalized weakness that started 4 days ago. She noticed that her regular activities were taking her a longer time to complete because of dyspnea. Denied any cough or fever. Due to worsening symptoms, pt presented to the ED. In the ED, pt noted to be tachypneic, hypoxic, requiring about 4 L of O2, with BP around 174/62.  Labs showed WBC 12.3, potassium 5.3, BNP 134.8, otherwise unremarkable.  Chest x-ray showed bilateral lower lobe hazy opacities concerning for pneumonia.  Patient admitted for further management.    Today, pt denies any new complaints, currently now on RA saturating well, denies any chest pain, fever/chills     Assessment/Plan: Active Problems:   PNA (pneumonia)   Acute respiratory failure with hypoxia BLL CAP Acute/flash pulmonary edema Currently on RA ABG on BiPAP unremarkable Currently afebrile, with no leukocytosis Chest x-ray with noted bilateral infiltrates Procalcitonin 0.15,-->0.39 COVID, flu negative Respiratory viral panel negative Continue azithromycin, Rocephin Received IV Lasix 40 mg X 2, further dosing dependent on clinical status Duo nebs, cough suppressants as needed Supplemental O2 as needed Monitor closely  ?Acute on chronic diastolic HF 2+ bilateral lower extremity edema ECHO showed EF 60-65%, no RWMA, Grade 1DD Prn lasix pending clinical status  Elevated D-dimer D-dimer 3.89, likely elevated due to PNA DVT Doppler BLE, negative for DVT Hold off CTA chest due to significant improvement  Hypertension Better controlled Restart home Norvasc, reduce Coreg dose Hold Benicar due to mild bump in Cr IV hydralazine as needed  Diabetes mellitus type 2, controlled Last A1c  5.8 SSI, Accu-Cheks, hypoglycemic protocol Hold home metformin, glipizide  Normocytic/deficiency anemia Hemoglobin stable Anemia panel with sats 10, otherwise unremarkable  Continue oral iron and Vit B12 supplementation Daily CBC  Hyperlipidemia Hold home Crestor for now  Obesity Lifestyle modification advised   Estimated body mass index is 34.29 kg/m as calculated from the following:   Height as of this encounter: 5' 3.5" (1.613 m).   Weight as of this encounter: 89.2 kg.     Code Status: Full  Family Communication: Husband at bedside  Disposition Plan: Status is: Inpatient  Remains inpatient appropriate because: Level of care    Consultants: PCCM  Procedures: None  Antimicrobials: Azithromycin Ceftriaxone  DVT prophylaxis: Lovenox     Objective: Vitals:   11/21/20 0923 11/21/20 1000 11/21/20 1200 11/21/20 1416  BP: (!) 148/54   (!) 128/52  Pulse:  80  64  Resp:  14  18  Temp:   97.9 F (36.6 C) 97.8 F (36.6 C)  TempSrc:   Oral Oral  SpO2:  98%  98%  Weight:      Height:        Intake/Output Summary (Last 24 hours) at 11/21/2020 1708 Last data filed at 11/21/2020 1437 Gross per 24 hour  Intake 1130.63 ml  Output 1551 ml  Net -420.37 ml   Filed Weights   11/19/20 0012 11/19/20 0738 11/21/20 0336  Weight: 91.1 kg 92.1 kg 89.2 kg    Exam: General: NAD Cardiovascular: S1, S2 present Respiratory: Diminished BS b/l  Abdomen: Soft, nontender, nondistended, bowel sounds present Musculoskeletal: Trace bilateral pedal edema noted Skin: Normal Psychiatry: Normal mood     Data Reviewed: CBC: Recent Labs  Lab 11/18/20 0847 11/19/20 0509 11/20/20 0235  11/21/20 0247  WBC 12.3* 9.4 9.0 8.3  NEUTROABS 9.9*  --  5.1 4.6  HGB 11.8* 9.0* 10.0* 9.4*  HCT 37.5 27.7* 31.4* 28.5*  MCV 96.4 95.5 95.4 93.8  PLT 294 222 225 230   Basic Metabolic Panel: Recent Labs  Lab 11/18/20 0847 11/18/20 1435 11/19/20 0509 11/20/20 0235  11/21/20 0247  NA 136 139 136 141 137  K 5.3* 4.3 4.1 3.8 3.7  CL 104 105 105 104 100  CO2 23 24 26 27 27   GLUCOSE 176* 154* 127* 105* 118*  BUN 30* 30* 26* 27* 33*  CREATININE 0.97 0.95 0.99 1.12* 1.17*  CALCIUM 9.7 9.5 8.8* 9.1 8.9  PHOS  --  3.6  --   --   --    GFR: Estimated Creatinine Clearance: 45.2 mL/min (A) (by C-G formula based on SCr of 1.17 mg/dL (H)). Liver Function Tests: Recent Labs  Lab 11/18/20 0847 11/18/20 1435 11/19/20 0509  AST 24  --  13*  ALT 12  --  9  ALKPHOS 106  --  72  BILITOT 1.1  --  0.6  PROT 8.3*  --  6.2*  ALBUMIN 4.3 3.9 3.2*   No results for input(s): LIPASE, AMYLASE in the last 168 hours. No results for input(s): AMMONIA in the last 168 hours. Coagulation Profile: No results for input(s): INR, PROTIME in the last 168 hours. Cardiac Enzymes: No results for input(s): CKTOTAL, CKMB, CKMBINDEX, TROPONINI in the last 168 hours. BNP (last 3 results) No results for input(s): PROBNP in the last 8760 hours. HbA1C: No results for input(s): HGBA1C in the last 72 hours.  CBG: Recent Labs  Lab 11/20/20 1733 11/20/20 2133 11/21/20 0750 11/21/20 1134 11/21/20 1630  GLUCAP 106* 135* 114* 213* 106*   Lipid Profile: No results for input(s): CHOL, HDL, LDLCALC, TRIG, CHOLHDL, LDLDIRECT in the last 72 hours. Thyroid Function Tests: No results for input(s): TSH, T4TOTAL, FREET4, T3FREE, THYROIDAB in the last 72 hours. Anemia Panel: Recent Labs    11/20/20 0235  VITAMINB12 392  FOLATE 9.5  FERRITIN 67  TIBC 332  IRON 33   Urine analysis: No results found for: COLORURINE, APPEARANCEUR, LABSPEC, PHURINE, GLUCOSEU, HGBUR, BILIRUBINUR, KETONESUR, PROTEINUR, UROBILINOGEN, NITRITE, LEUKOCYTESUR Sepsis Labs: @LABRCNTIP (procalcitonin:4,lacticidven:4)  ) Recent Results (from the past 240 hour(s))  Resp Panel by RT-PCR (Flu A&B, Covid) Nasopharyngeal Swab     Status: None   Collection Time: 11/18/20  8:47 AM   Specimen: Nasopharyngeal Swab;  Nasopharyngeal(NP) swabs in vial transport medium  Result Value Ref Range Status   SARS Coronavirus 2 by RT PCR NEGATIVE NEGATIVE Final    Comment: (NOTE) SARS-CoV-2 target nucleic acids are NOT DETECTED.  The SARS-CoV-2 RNA is generally detectable in upper respiratory specimens during the acute phase of infection. The lowest concentration of SARS-CoV-2 viral copies this assay can detect is 138 copies/mL. A negative result does not preclude SARS-Cov-2 infection and should not be used as the sole basis for treatment or other patient management decisions. A negative result may occur with  improper specimen collection/handling, submission of specimen other than nasopharyngeal swab, presence of viral mutation(s) within the areas targeted by this assay, and inadequate number of viral copies(<138 copies/mL). A negative result must be combined with clinical observations, patient history, and epidemiological information. The expected result is Negative.  Fact Sheet for Patients:   Fact Sheet for Healthcare Providers:  13/09/22  This test is no t yet approved or cleared by the [employer] and  has been authorized for detection and/or diagnosis of SARS-CoV-2 by FDA under an Emergency Use Authorization (EUA). This EUA will remain  in effect (meaning this test can be used) for the duration of the COVID-19 declaration under Section 564(b)(1) of the Act, 21 U.S.C.section 360bbb-3(b)(1), unless the authorization is terminated  or revoked sooner.       Influenza A by PCR NEGATIVE NEGATIVE Final   Influenza B by PCR NEGATIVE NEGATIVE Final    Comment: (NOTE) The Xpert Xpress SARS-CoV-2/FLU/RSV plus assay is intended as an aid in the diagnosis of influenza from Nasopharyngeal swab specimens and should not be used as a sole basis for treatment. Nasal washings and aspirates are unacceptable for Xpert Xpress  SARS-CoV-2/FLU/RSV testing.  Fact Sheet for Patients: [employer]  Fact Sheet for Healthcare Providers: [employer]  This test is not yet approved or cleared by the [place] FDA and has been authorized for detection and/or diagnosis of SARS-CoV-2 by FDA under an Emergency Use Authorization (EUA). This EUA will remain in effect (meaning this test can be used) for the duration of the COVID-19 declaration under Section 564(b)(1) of the Act, 21 U.S.C. section 360bbb-3(b)(1), unless the authorization is terminated or revoked.  Performed at [facility], [address], [place], [place] [address]   Culture, blood (Routine X 2) w Reflex to ID Panel     Status: None (Preliminary result)   Collection Time: 11/18/20 10:13 AM   Specimen: BLOOD  Result Value Ref Range Status   Specimen Description   Final    BLOOD BLOOD RIGHT HAND Performed at [facility], [address], [place], [place] [address]    Special Requests   Final    BOTTLES DRAWN AEROBIC ONLY Blood Culture adequate volume Performed at [facility], [address] [place], [place] [address]    Culture   Final    NO GROWTH 3 DAYS Performed at [facility], [address] [place], [place] [address]    Report Status PENDING  Incomplete  Culture, blood (Routine X 2) w Reflex to ID Panel     Status: None (Preliminary result)   Collection Time: 11/18/20 10:18 AM   Specimen: BLOOD  Result Value Ref Range Status   Specimen Description   Final    BLOOD RIGHT ANTECUBITAL Performed at [facility], [address], [place], [place] [address]    Special Requests   Final    BOTTLES DRAWN AEROBIC AND ANAEROBIC Blood Culture adequate volume Performed at [facility], [address] [place], [place] [address]    Culture   Final    NO GROWTH 3 DAYS Performed at [facility], [address] [place], [place] [address]    Report Status PENDING  Incomplete  MRSA Next Gen by PCR, Nasal     Status: None   Collection Time: 11/19/20  7:38 AM   Specimen: Nasal Mucosa; Nasal Swab  Result Value Ref Range Status   MRSA by PCR Next Gen NOT DETECTED NOT DETECTED Final    Comment: (NOTE) The GeneXpert MRSA Assay (FDA approved for NASAL specimens only), is one component of a comprehensive MRSA colonization surveillance program. It is not intended to diagnose MRSA infection nor to guide or monitor treatment for MRSA infections. Test performance is not FDA approved in patients less than 48 years old. Performed at [facility], [address] [place], [place] [address]   Respiratory (~20  pathogens) panel by PCR     Status: None   Collection Time: 11/19/20  9:56 AM   Specimen: Nasopharyngeal Swab; Respiratory  Result Value Ref Range Status   Adenovirus NOT DETECTED NOT DETECTED Final   Coronavirus 229E NOT DETECTED NOT DETECTED Final    Comment: (NOTE) The Coronavirus on the Respiratory Panel, DOES NOT test for the novel  Coronavirus (2019 nCoV)    Coronavirus HKU1 NOT DETECTED NOT DETECTED Final   Coronavirus NL63 NOT DETECTED NOT DETECTED Final   Coronavirus OC43 NOT DETECTED NOT DETECTED Final   Metapneumovirus NOT DETECTED NOT DETECTED Final   Rhinovirus / Enterovirus NOT DETECTED NOT DETECTED Final   Influenza A NOT DETECTED NOT DETECTED Final   Influenza B NOT DETECTED NOT DETECTED Final   Parainfluenza Virus 1 NOT DETECTED NOT DETECTED Final   Parainfluenza Virus 2 NOT DETECTED NOT DETECTED Final   Parainfluenza Virus 3 NOT DETECTED NOT DETECTED Final   Parainfluenza Virus 4 NOT DETECTED NOT DETECTED Final   Respiratory Syncytial Virus NOT DETECTED NOT DETECTED Final   Bordetella pertussis NOT DETECTED NOT DETECTED Final   Bordetella Parapertussis NOT DETECTED NOT DETECTED Final   Chlamydophila pneumoniae NOT DETECTED NOT  DETECTED Final   Mycoplasma pneumoniae NOT DETECTED NOT DETECTED Final    Comment: Performed at [facility], [address], [place], [place] [address]      Studies: No results found.  Scheduled Meds:  amLODipine  10 mg Oral Daily   carvedilol  12.5 mg Oral BID WC   chlorhexidine  15 mL Mouth Rinse BID   Chlorhexidine Gluconate Cloth  6 each Topical Daily   enoxaparin (LOVENOX) injection  40 mg Subcutaneous Q24H   ferrous sulfate  325 mg Oral Q breakfast   insulin aspart  0-15 Units Subcutaneous TID WC   insulin aspart  0-5 Units Subcutaneous QHS   mouth rinse  15 mL Mouth Rinse q12n4p   vitamin B-12  500 mcg Oral Daily    Continuous Infusions:  sodium chloride Stopped (11/18/20 0911)   azithromycin Stopped (11/21/20 1320)   cefTRIAXone (ROCEPHIN)  IV Stopped (11/21/20 1424)     LOS: 3 days     Briant Cedar, MD [facility]  If 7PM-7AM, please contact night-coverage [email or web address] 11/21/2020, 5:08 PM   .

## 2020-11-21 NOTE — Progress Notes (Signed)
Pt has arrived to Room 1519 from ICU. Alert and oriented. Spouse present. No pt c/o.

## 2020-11-22 DIAGNOSIS — J810 Acute pulmonary edema: Secondary | ICD-10-CM

## 2020-11-22 LAB — BASIC METABOLIC PANEL
Anion gap: 7 (ref 5–15)
BUN: 29 mg/dL — ABNORMAL HIGH (ref 8–23)
CO2: 28 mmol/L (ref 22–32)
Calcium: 9.1 mg/dL (ref 8.9–10.3)
Chloride: 103 mmol/L (ref 98–111)
Creatinine, Ser: 1.06 mg/dL — ABNORMAL HIGH (ref 0.44–1.00)
GFR, Estimated: 55 mL/min — ABNORMAL LOW (ref >60)
Glucose, Bld: 123 mg/dL — ABNORMAL HIGH (ref 70–99)
Potassium: 3.7 mmol/L (ref 3.5–5.1)
Sodium: 138 mmol/L (ref 135–145)

## 2020-11-22 LAB — CBC WITH DIFFERENTIAL/PLATELET
Abs Immature Granulocytes: 0.02 10*3/uL (ref 0.00–0.07)
Basophils Absolute: 0.1 10*3/uL (ref 0.0–0.1)
Basophils Relative: 1 %
Eosinophils Absolute: 0.3 10*3/uL (ref 0.0–0.5)
Eosinophils Relative: 4 %
HCT: 31.4 % — ABNORMAL LOW (ref 36.0–46.0)
Hemoglobin: 10.1 g/dL — ABNORMAL LOW (ref 12.0–15.0)
Immature Granulocytes: 0 %
Lymphocytes Relative: 24 %
Lymphs Abs: 1.8 10*3/uL (ref 0.7–4.0)
MCH: 30.1 pg (ref 26.0–34.0)
MCHC: 32.2 g/dL (ref 30.0–36.0)
MCV: 93.5 fL (ref 80.0–100.0)
Monocytes Absolute: 0.9 10*3/uL (ref 0.1–1.0)
Monocytes Relative: 13 %
Neutro Abs: 4.3 10*3/uL (ref 1.7–7.7)
Neutrophils Relative %: 58 %
Platelets: 244 10*3/uL (ref 150–400)
RBC: 3.36 MIL/uL — ABNORMAL LOW (ref 3.87–5.11)
RDW: 13.2 % (ref 11.5–15.5)
WBC: 7.4 10*3/uL (ref 4.0–10.5)
nRBC: 0 % (ref 0.0–0.2)

## 2020-11-22 LAB — GLUCOSE, CAPILLARY
Glucose-Capillary: 128 mg/dL — ABNORMAL HIGH (ref 70–99)
Glucose-Capillary: 171 mg/dL — ABNORMAL HIGH (ref 70–99)

## 2020-11-22 MED ORDER — CARVEDILOL 25 MG PO TABS: 12.5000 mg | ORAL_TABLET | Freq: Two times a day (BID) | ORAL | Status: DC

## 2020-11-22 MED ORDER — FUROSEMIDE 20 MG PO TABS: 20.0000 mg | ORAL_TABLET | Freq: Every day | ORAL | 0 refills | Status: DC

## 2020-11-22 NOTE — Progress Notes (Signed)
Physical Therapy Treatment Patient Details Name: Felicia Davidson MRN: [id number] DOB: 1945/08/05 Today's Date: 11/22/2020   History of Present Illness Felicia Davidson is a 75 y/o woman with SOB and cough who presented to the ED on 11/9. She was diagnosed with pneumonia    PT Comments    Pt continues very cooperative and up to ambulate in halls with RW and sans AD with noted deterioration in balance.  Pt and spouse now expressing concerns regarding balance and ability to function at home - now open to HHPT follow up to further address balance and function at home.  Spouse also relates does not have RW at home and pt would greatly benefit from use of youth level RW for home use.   Recommendations for follow up therapy are one component of a multi-disciplinary discharge planning process, led by the attending physician.  Recommendations may be updated based on patient status, additional functional criteria and insurance authorization.  Follow Up Recommendations  Home health PT     Assistance Recommended at Discharge Intermittent Supervision/Assistance  Equipment Recommendations  Rolling walker (2 wheels) (youth level RW please)    Recommendations for Other Services       Precautions / Restrictions Precautions Precautions: Fall Precaution Comments: monitor sat Restrictions Weight Bearing Restrictions: No     Mobility  Bed Mobility               General bed mobility comments: up in chair and requests back to same    Transfers Overall transfer level: Needs assistance Equipment used: Rolling walker (2 wheels) Transfers: Sit to/from Stand Sit to Stand: Min guard;Supervision           General transfer comment: min cues for use of UEs to self assist    Ambulation/Gait Ambulation/Gait assistance: Min assist;Min guard Gait Distance (Feet): 400 Feet Assistive device: Rolling walker (2 wheels);1 person hand held assist Gait Pattern/deviations: Step-through  pattern;Shuffle;Trunk flexed       General Gait Details: Min guard assist and cues for posture and position from RW with noted deterioration in balance and need for min assist sans AD   Stairs             Wheelchair Mobility    Modified Rankin (Stroke Patients Only)       Balance Overall balance assessment: Needs assistance Sitting-balance support: No upper extremity supported;Feet supported Sitting balance-Leahy Scale: Good     Standing balance support: No upper extremity supported Standing balance-Leahy Scale: Fair                              Cognition Arousal/Alertness: Awake/alert Behavior During Therapy: WFL for tasks assessed/performed;Impulsive Overall Cognitive Status: Within Functional Limits for tasks assessed                                          Exercises      General Comments        Pertinent Vitals/Pain Pain Assessment: No/denies pain    Home Living                          Prior Function            PT Goals (current goals can now be found in the care plan section) Acute Rehab PT Goals Patient Stated Goal: Resume previous  lifestyle PT Goal Formulation: With patient Time For Goal Achievement: 12/05/20 Potential to Achieve Goals: Good Progress towards PT goals: Progressing toward goals    Frequency    Min 3X/week      PT Plan Discharge plan needs to be updated    Co-evaluation              AM-PAC PT "6 Clicks" Mobility   Outcome Measure  Help needed turning from your back to your side while in a flat bed without using bedrails?: None Help needed moving from lying on your back to sitting on the side of a flat bed without using bedrails?: None Help needed moving to and from a bed to a chair (including a wheelchair)?: A Little Help needed standing up from a chair using your arms (e.g., wheelchair or bedside chair)?: A Little Help needed to walk in hospital room?: A Little Help  needed climbing 3-5 steps with a railing? : A Little 6 Click Score: 20    End of Session Equipment Utilized During Treatment: Gait belt Activity Tolerance: Patient tolerated treatment well Patient left: in chair;with call bell/phone within reach;with family/visitor present Nurse Communication: Mobility status PT Visit Diagnosis: Difficulty in walking, not elsewhere classified (R26.2);Muscle weakness (generalized) (M62.81)     Time: 1610-9604 PT Time Calculation (min) (ACUTE ONLY): 34 min  Charges:  $Gait Training: 23-37 mins                     Felicia Davidson PT Acute Rehabilitation Services Pager [phone] Office [phone]    Felicia Davidson 11/22/2020, 12:39 PM

## 2020-11-22 NOTE — TOC Initial Note (Signed)
Transition of Care [facility]) - Initial/Assessment Note    Patient Details  Name: Felicia Davidson MRN: [id number] Date of Birth: November 20, 1945  Transition of [facility]) CM/SW Contact:    Joanne Chars, LCSW Phone Number: 11/22/2020, 2:16 PM  Clinical Narrative:       CSW met with pt and husband Juanda Crumble to discuss DC recommendation for [facility].  Permission given to speak with husband present.  Pt agreeable to [facility], choice document given, they are requesting [facility].  They also would like recommended walker.  PCP in place.  Current DME in home: shower chair, 3n1.    CSW spoke with Malachy Mood at [facility] and she declined this referral.  CSW spoke with pt husband regarding this and he agrees to referral to [facility].  Levada Dy at [facility] accepts referral.              Expected Discharge Plan: [place] Barriers to Discharge: No Barriers Identified   Patient Goals and CMS Choice Patient states their goals for this hospitalization and ongoing recovery are:: "walk faster" CMS Medicare.gov Compare Post Acute Care list provided to:: Patient Choice offered to / list presented to : Patient  Expected Discharge Plan and Services Expected Discharge Plan: [place] Choice: Durable Medical Equipment, Home Health Living arrangements for the past 2 months: Single Family Home Expected Discharge Date: 11/22/20               DME Arranged: Gilford Rile rolling DME Agency: [employer] Date DME Agency Contacted: 11/22/20 Time DME Agency Contacted: [phone] Representative spoke with at DME Agency: [place]: PT [place]: [facility] Date [place]: 11/22/20 Time [place]: [id number] Representative spoke with at [place]: Levada Dy  Prior Living Arrangements/Services Living arrangements for the past 2 months: [place] with:: Spouse Patient language and need for interpreter reviewed:: Yes Do you feel safe going back  to the place where you live?: Yes      Need for Family Participation in Patient Care: No (Comment) Care giver support system in place?: Yes (comment) Current home services: Other (comment) (none) Criminal Activity/Legal Involvement Pertinent to Current Situation/Hospitalization: No - Comment as needed  Activities of Daily Living Home Assistive Devices/Equipment: CBG Meter, Eyeglasses ADL Screening (condition at time of admission) Patient's cognitive ability adequate to safely complete daily activities?: Yes Is the patient deaf or have difficulty hearing?: No Does the patient have difficulty seeing, even when wearing glasses/contacts?: No Does the patient have difficulty concentrating, remembering, or making decisions?: No Patient able to express need for assistance with ADLs?: Yes Does the patient have difficulty dressing or bathing?: Yes (secondary to shortness of breath and weakness) Independently performs ADLs?: No (secondary to shortness of breath and weakness) Communication: Independent Dressing (OT): Needs assistance Is this a change from baseline?: Change from baseline, expected to last >3 days Grooming: Independent Feeding: Independent Bathing: Needs assistance Is this a change from baseline?: Change from baseline, expected to last >3 days Toileting: Needs assistance Is this a change from baseline?: Change from baseline, expected to last >3days In/Out Bed: Needs assistance Is this a change from baseline?: Change from baseline, expected to last >3 days Walks in Home: Needs assistance Is this a change from baseline?: Change from baseline, expected to last >3 days Does the patient have difficulty walking or climbing stairs?: Yes (secondary to shortness of breath and weakness) Weakness of Legs: Both Weakness of  Arms/Hands: None  Permission Sought/Granted Permission sought to share information with : Family Supports Permission granted to share information with : Yes, Verbal  Permission Granted  Share Information with NAME: huband Visual merchandiser granted to share info w AGENCY: HH        Emotional Assessment Appearance:: Appears stated age Attitude/Demeanor/Rapport: Engaged Affect (typically observed): Appropriate, Pleasant Orientation: :  (Not recorded, appears oriented) Alcohol / Substance Use: Not Applicable Psych Involvement: No (comment)  Admission diagnosis:  PNA (pneumonia) [J18.9] Community acquired pneumonia, unspecified laterality [J18.9] Patient Active Problem List   Diagnosis Date Noted   PNA (pneumonia) 11/18/2020   Subclinical hyperthyroidism 07/24/2015   Multinodular goiter 03/24/2015   PCP:  Leonard Downing, MD Pharmacy:   [facility] [place] [address] Phone: [phone] Fax: [phone]     Social Determinants of Health (SDOH) Interventions    Readmission Risk Interventions No flowsheet data found.

## 2020-11-22 NOTE — Discharge Summary (Signed)
Discharge Summary  Felicia Davidson WUJ:[id number] DOB: July 15, 1945  PCP: Kaleen Mask, MD  Admit date: 11/18/2020 Discharge date: 11/22/2020  Time spent: 40 mins  Recommendations for Outpatient Follow-up:  PCP in 1 week  Discharge Diagnoses:  Active Hospital Problems   Diagnosis Date Noted   PNA (pneumonia) 11/18/2020    Resolved Hospital Problems  No resolved problems to display.    Discharge Condition: Stable  Diet recommendation: Heart healthy  Vitals:   11/22/20 0443 11/22/20 1306  BP: (!) 151/62 (!) 125/49  Pulse: 77 69  Resp: 18 16  Temp: 98 F (36.7 C) 98.5 F (36.9 C)  SpO2: 96% 98%    History of present illness:  Felicia Davidson is a 75 y.o. female with medical history significant of DM2, HTN, HLD. Presenting with worsening dyspnea and generalized weakness that started 4 days ago. She noticed that her regular activities were taking her a longer time to complete because of dyspnea. Denied any cough or fever. Due to worsening symptoms, pt presented to the ED. In the ED, pt noted to be tachypneic, hypoxic, requiring about 4 L of O2, with BP around 174/62.  Labs showed WBC 12.3, potassium 5.3, BNP 134.8, otherwise unremarkable.  Chest x-ray showed bilateral lower lobe hazy opacities concerning for pneumonia.  Patient admitted for further management.    Today, pt denies any new complaints, reports breathing has significantly improved, denies any chest pain, abdominal pain, N/V/F/C. Pt advised to follow up with PCP in 1 week.    Hospital Course:  Active Problems:   PNA (pneumonia)  Acute respiratory failure with hypoxia BLL CAP Currently on RA, saturating well Currently afebrile, with no leukocytosis Chest x-ray with noted bilateral infiltrates COVID, flu negative Respiratory viral panel negative Completed azithromycin, Rocephin Follow up with PCP in 1 week   ??Acute on chronic diastolic HF Acute/flash pulmonary edema Trace bilateral lower  extremity edema ECHO showed EF 60-65%, no RWMA, Grade 1DD Received IV Lasix 40 mg X 2 days, will d/c patient on 20 mg daily of lasix Tight BP control PCP monitor fluid status, BMP and adjust accordingly    Elevated D-dimer D-dimer 3.89, likely elevated due to PNA DVT Doppler BLE, negative for DVT Held off CTA chest due to significant improvement   Hypertension Better controlled Restart home Norvasc, reduce Coreg dose to 12.5 mg BID due to HR in the 60s on this dose Restart Benicar  Tight BP control to avoid worsening dCHF   Diabetes mellitus type 2, controlled Last A1c 5.8 Continue home metformin, glipizide   Normocytic/deficiency anemia Hemoglobin stable Anemia panel with sats 10, otherwise unremarkable  Continue oral iron and Vit B12 supplementation   Hyperlipidemia Continue home Crestor for now   Obesity Lifestyle modification advised      Estimated body mass index is 34.29 kg/m as calculated from the following:   Height as of this encounter: 5' 3.5" (1.613 m).   Weight as of this encounter: 89.2 kg.    Procedures: None  Consultations: PCCM  Discharge Exam: BP (!) 125/49 (BP Location: Left Arm)   Pulse 69   Temp 98.5 F (36.9 C) (Oral)   Resp 16   Ht 5' 3.5" (1.613 m)   Wt 89.2 kg   SpO2 98%   BMI 34.29 kg/m   General: NAD  Cardiovascular: S1, S2 present Respiratory: CTAB Abdomen: Soft, nontender, nondistended, bowel sounds present Musculoskeletal: Trace bilateral pedal edema noted Skin: Normal Psychiatry: Normal mood   Discharge Instructions You were cared for  by a hospitalist during your hospital stay. If you have any questions about your discharge medications or the care you received while you were in the hospital after you are discharged, you can call the unit and asked to speak with the hospitalist on call if the hospitalist that took care of you is not available. Once you are discharged, your primary care physician will handle any further  medical issues. Please note that NO REFILLS for any discharge medications will be authorized once you are discharged, as it is imperative that you return to your primary care physician (or establish a relationship with a primary care physician if you do not have one) for your aftercare needs so that they can reassess your need for medications and monitor your lab values.   Allergies as of 11/22/2020       Reactions   Lipitor [atorvastatin] Other (See Comments)   Sore muscles   Penicillins Other (See Comments)   Stiff joints        Medication List     STOP taking these medications    Naproxen Sodium 220 MG Caps       TAKE these medications    amLODipine 10 MG tablet Commonly known as: NORVASC Take 10 mg by mouth daily.   CALCIUM 600 PO Take 1 tablet by mouth 2 (two) times daily. With Minerals 2 times daily   carvedilol 25 MG tablet Commonly known as: COREG Take 0.5 tablets (12.5 mg total) by mouth 2 (two) times daily. What changed: how much to take   ferrous sulfate 325 (65 FE) MG tablet Take 325 mg by mouth daily with breakfast.   furosemide 20 MG tablet Commonly known as: Lasix Take 1 tablet (20 mg total) by mouth daily.   glipiZIDE 10 MG tablet Commonly known as: GLUCOTROL Take 5 mg by mouth 2 (two) times daily before a meal.   metFORMIN 1000 MG tablet Commonly known as: GLUCOPHAGE Take 1,000 mg by mouth 2 (two) times daily with a meal.   olmesartan 40 MG tablet Commonly known as: BENICAR Take 40 mg by mouth every evening.   rosuvastatin 10 MG tablet Commonly known as: CRESTOR Take 10 mg by mouth daily.   vitamin B-12 500 MCG tablet Commonly known as: CYANOCOBALAMIN Take 500 mcg by mouth daily.               Durable Medical Equipment  (From admission, onward)           Start     Ordered   11/22/20 1252  For home use only DME Walker rolling  Once       Question Answer Comment  Walker: With 5 Inch Wheels   Patient needs a walker to  treat with the following condition Balance problem      11/22/20 1252           Allergies  Allergen Reactions   Lipitor [Atorvastatin] Other (See Comments)    Sore muscles   Penicillins Other (See Comments)    Stiff joints    Follow-up Information     Kaleen Mask, MD. Schedule an appointment as soon as possible for a visit in 1 week(s).   Specialty: Family Medicine Contact information: [address] [place] [address] [phone]                  The results of significant diagnostics from this hospitalization (including imaging, microbiology, ancillary and laboratory) are listed below for reference.    Significant  Diagnostic Studies: DG CHEST PORT 1 VIEW  Result Date: 11/19/2020 CLINICAL DATA:  Shortness of breath EXAM: PORTABLE CHEST 1 VIEW COMPARISON:  Radiograph dated November 18, 2020 FINDINGS: The heart size and mediastinal contours are within normal limits. Bilateral lower lobes hazy opacities, unchanged. No large pleural effusion. The visualized skeletal structures are unremarkable. IMPRESSION: Bilateral lower lobe hazy opacities concerning for pneumonia, unchanged. Electronically Signed   By: Larose Hires D.O.   On: 11/19/2020 09:07   DG Chest Port 1 View  Result Date: 11/18/2020 CLINICAL DATA:  Shortness of breath EXAM: PORTABLE CHEST 1 VIEW COMPARISON:  None. FINDINGS: Heart size is within normal limits. Mediastinum appears normal. Pulmonary vasculature is normal. Increased hazy and irregular opacities in the bilateral lower lung zones. No pneumothorax or significant pleural effusion. IMPRESSION: Increased opacities in the bilateral lower lung zones suspicious for infiltrate/pneumonia. Electronically Signed   By: Jannifer Hick M.D.   On: 11/18/2020 09:28   ECHOCARDIOGRAM COMPLETE  Result Date: 11/19/2020    ECHOCARDIOGRAM REPORT   Patient Name:   Felicia Davidson Date of Exam: 11/19/2020 Medical Rec #:  [id number]      Height:        63.5 in Accession #:    [id number]     Weight:       203.0 lb Date of Birth:  Jul 03, 1945     BSA:          1.958 m Patient Age:    74 years       BP:           145/45 mmHg Patient Gender: F              HR:           78 bpm. Exam Location:  Inpatient Procedure: 2D Echo, Cardiac Doppler and Color Doppler Indications:    Dyspnea  History:        Patient has no prior history of Echocardiogram examinations.                 Signs/Symptoms:Pul. edema, Pneumonia and Dyspnea; Risk                 Factors:Former Smoker, Diabetes, Hypertension and Obesity.  Sonographer:    Lavenia Atlas RDCS Referring Phys: [id number] Monica Martinez Libbi Towner IMPRESSIONS  1. Left ventricular ejection fraction, by estimation, is 60 to 65%. The left ventricle has normal function. The left ventricle has no regional wall motion abnormalities. There is mild concentric left ventricular hypertrophy. Left ventricular diastolic parameters are consistent with Grade I diastolic dysfunction (impaired relaxation).  2. Right ventricular systolic function is normal. The right ventricular size is normal. There is normal pulmonary artery systolic pressure.  3. The mitral valve is normal in structure. Mild mitral valve regurgitation. No evidence of mitral stenosis.  4. The aortic valve is normal in structure. Aortic valve regurgitation is not visualized. Mild aortic valve sclerosis is present, with no evidence of aortic valve stenosis.  5. The inferior vena cava is normal in size with greater than 50% respiratory variability, suggesting right atrial pressure of 3 mmHg. FINDINGS  Left Ventricle: Left ventricular ejection fraction, by estimation, is 60 to 65%. The left ventricle has normal function. The left ventricle has no regional wall motion abnormalities. The left ventricular internal cavity size was normal in size. There is  mild concentric left ventricular hypertrophy. Left ventricular diastolic parameters are consistent with Grade I diastolic dysfunction  (impaired relaxation). Right Ventricle: The right ventricular size is  normal. No increase in right ventricular wall thickness. Right ventricular systolic function is normal. There is normal pulmonary artery systolic pressure. The tricuspid regurgitant velocity is 2.69 m/s, and  with an assumed right atrial pressure of 3 mmHg, the estimated right ventricular systolic pressure is 31.9 mmHg. Left Atrium: Left atrial size was normal in size. Right Atrium: Right atrial size was normal in size. Pericardium: There is no evidence of pericardial effusion. Mitral Valve: The mitral valve is normal in structure. Mild mitral annular calcification. Mild mitral valve regurgitation. No evidence of mitral valve stenosis. Tricuspid Valve: The tricuspid valve is normal in structure. Tricuspid valve regurgitation is not demonstrated. No evidence of tricuspid stenosis. Aortic Valve: The aortic valve is normal in structure. Aortic valve regurgitation is not visualized. Aortic regurgitation PHT measures 346 msec. Mild aortic valve sclerosis is present, with no evidence of aortic valve stenosis. Aortic valve mean gradient  measures 12.0 mmHg. Aortic valve peak gradient measures 20.2 mmHg. Aortic valve area, by VTI measures 2.07 cm. Pulmonic Valve: The pulmonic valve was normal in structure. Pulmonic valve regurgitation is not visualized. No evidence of pulmonic stenosis. Aorta: The aortic root is normal in size and structure. Venous: The inferior vena cava is normal in size with greater than 50% respiratory variability, suggesting right atrial pressure of 3 mmHg. IAS/Shunts: No atrial level shunt detected by color flow Doppler.  LEFT VENTRICLE PLAX 2D LVIDd:         4.50 cm   Diastology LVIDs:         2.90 cm   LV e' medial:    6.74 cm/s LV PW:         1.10 cm   LV E/e' medial:  13.7 LV IVS:        1.10 cm   LV e' lateral:   4.57 cm/s LVOT diam:     2.10 cm   LV E/e' lateral: 20.2 LV SV:         107 LV SV Index:   55 LVOT Area:     3.46  cm  RIGHT VENTRICLE RV Basal diam:  2.70 cm RV S prime:     10.00 cm/s TAPSE (M-mode): 1.3 cm LEFT ATRIUM             Index        RIGHT ATRIUM           Index LA diam:        4.10 cm 2.09 cm/m   RA Area:     11.10 cm LA Vol (A2C):   41.5 ml 21.20 ml/m  RA Volume:   23.00 ml  11.75 ml/m LA Vol (A4C):   45.3 ml 23.14 ml/m LA Biplane Vol: 46.1 ml 23.55 ml/m  AORTIC VALVE AV Area (Vmax):    2.00 cm AV Area (Vmean):   1.98 cm AV Area (VTI):     2.07 cm AV Vmax:           225.00 cm/s AV Vmean:          165.000 cm/s AV VTI:            0.518 m AV Peak Grad:      20.2 mmHg AV Mean Grad:      12.0 mmHg LVOT Vmax:         130.00 cm/s LVOT Vmean:        94.400 cm/s LVOT VTI:          0.309 m LVOT/AV VTI ratio: 0.60 AI  PHT:            346 msec  AORTA Ao Root diam: 3.00 cm MITRAL VALVE                TRICUSPID VALVE MV Area (PHT): 5.93 cm     TR Peak grad:   28.9 mmHg MV Decel Time: 128 msec     TR Vmax:        269.00 cm/s MV E velocity: 92.40 cm/s MV A velocity: 123.00 cm/s  SHUNTS MV E/A ratio:  0.75         Systemic VTI:  0.31 m                             Systemic Diam: 2.10 cm Kardie Tobb DO Electronically signed by Thomasene Ripple DO Signature Date/Time: 11/19/2020/2:50:52 PM    Final    VAS [place] LOWER EXTREMITY VENOUS (DVT)  Result Date: 11/19/2020  Lower Venous DVT Study Patient Name:  Felicia Davidson  Date of Exam:   11/19/2020 Medical Rec #: [id number]       Accession #:    [id number] Date of Birth: [birth year]      Patient Gender: F Patient Age:   27 years Exam Location:  [facility] Procedure:      VAS [place] LOWER EXTREMITY VENOUS (DVT) Referring Phys: Karie Fetch --------------------------------------------------------------------------------  Indications: Edema.  Risk Factors: None identified. Limitations: Body habitus and poor ultrasound/tissue interface. Comparison Study: No prior studies. Performing Technologist: Chanda Busing RVT  Examination Guidelines: A complete evaluation includes B-mode  imaging, spectral Doppler, color Doppler, and power Doppler as needed of all accessible portions of each vessel. Bilateral testing is considered an integral part of a complete examination. Limited examinations for reoccurring indications may be performed as noted. The reflux portion of the exam is performed with the patient in reverse Trendelenburg.  +---------+---------------+---------+-----------+----------+-------------------+ RIGHT    CompressibilityPhasicitySpontaneityPropertiesThrombus Aging      +---------+---------------+---------+-----------+----------+-------------------+ CFV      Full           Yes      Yes                                      +---------+---------------+---------+-----------+----------+-------------------+ SFJ      Full                                                             +---------+---------------+---------+-----------+----------+-------------------+ FV Prox  Full                                                             +---------+---------------+---------+-----------+----------+-------------------+ FV Mid   Full                                                             +---------+---------------+---------+-----------+----------+-------------------+  FV Distal               Yes      Yes                                      +---------+---------------+---------+-----------+----------+-------------------+ PFV      Full                                                             +---------+---------------+---------+-----------+----------+-------------------+ POP      Full           Yes      Yes                                      +---------+---------------+---------+-----------+----------+-------------------+ PTV      Full                                                             +---------+---------------+---------+-----------+----------+-------------------+ PERO                                                   Not well visualized +---------+---------------+---------+-----------+----------+-------------------+   +---------+---------------+---------+-----------+----------+--------------+ LEFT     CompressibilityPhasicitySpontaneityPropertiesThrombus Aging +---------+---------------+---------+-----------+----------+--------------+ CFV      Full           Yes      Yes                                 +---------+---------------+---------+-----------+----------+--------------+ SFJ      Full                                                        +---------+---------------+---------+-----------+----------+--------------+ FV Prox  Full                                                        +---------+---------------+---------+-----------+----------+--------------+ FV Mid                  Yes      Yes                                 +---------+---------------+---------+-----------+----------+--------------+ FV Distal               Yes      Yes                                 +---------+---------------+---------+-----------+----------+--------------+  PFV      Full                                                        +---------+---------------+---------+-----------+----------+--------------+ POP      Full           Yes      Yes                                 +---------+---------------+---------+-----------+----------+--------------+ PTV      Full                                                        +---------+---------------+---------+-----------+----------+--------------+ PERO     Full                                                        +---------+---------------+---------+-----------+----------+--------------+     Summary: RIGHT: - There is no evidence of deep vein thrombosis in the lower extremity. However, portions of this examination were limited- see technologist comments above.  - No cystic structure found in the popliteal fossa.  LEFT: - There is  no evidence of deep vein thrombosis in the lower extremity. However, portions of this examination were limited- see technologist comments above.  - No cystic structure found in the popliteal fossa.  *See table(s) above for measurements and observations. Electronically signed by Sherald Hess MD on 11/19/2020 at 3:37:04 PM.    Final     Microbiology: Recent Results (from the past 240 hour(s))  Resp Panel by RT-PCR (Flu A&B, Covid) Nasopharyngeal Swab     Status: None   Collection Time: 11/18/20  8:47 AM   Specimen: Nasopharyngeal Swab; Nasopharyngeal(NP) swabs in vial transport medium  Result Value Ref Range Status   SARS Coronavirus 2 by RT PCR NEGATIVE NEGATIVE Final    Comment: (NOTE) SARS-CoV-2 target nucleic acids are NOT DETECTED.  The SARS-CoV-2 RNA is generally detectable in upper respiratory specimens during the acute phase of infection. The lowest concentration of SARS-CoV-2 viral copies this assay can detect is 138 copies/mL. A negative result does not preclude SARS-Cov-2 infection and should not be used as the sole basis for treatment or other patient management decisions. A negative result may occur with  improper specimen collection/handling, submission of specimen other than nasopharyngeal swab, presence of viral mutation(s) within the areas targeted by this assay, and inadequate number of viral copies(<138 copies/mL). A negative result must be combined with clinical observations, patient history, and epidemiological information. The expected result is Negative.  Fact Sheet for Patients:  BloggerCourse.com  Fact Sheet for Healthcare Providers:  SeriousBroker.it  This test is no t yet approved or cleared by the [place] FDA and  has been authorized for detection and/or diagnosis of SARS-CoV-2 by FDA under an Emergency Use Authorization (EUA). This EUA will remain  in effect (meaning this test can be used) for the  duration of the COVID-19 declaration  under Section 564(b)(1) of the Act, 21 U.S.C.section 360bbb-3(b)(1), unless the authorization is terminated  or revoked sooner.       Influenza A by PCR NEGATIVE NEGATIVE Final   Influenza B by PCR NEGATIVE NEGATIVE Final    Comment: (NOTE) The Xpert Xpress SARS-CoV-2/FLU/RSV plus assay is intended as an aid in the diagnosis of influenza from Nasopharyngeal swab specimens and should not be used as a sole basis for treatment. Nasal washings and aspirates are unacceptable for Xpert Xpress SARS-CoV-2/FLU/RSV testing.  Fact Sheet for Patients: BloggerCourse.com  Fact Sheet for Healthcare Providers: SeriousBroker.it  This test is not yet approved or cleared by the [place] FDA and has been authorized for detection and/or diagnosis of SARS-CoV-2 by FDA under an Emergency Use Authorization (EUA). This EUA will remain in effect (meaning this test can be used) for the duration of the COVID-19 declaration under Section 564(b)(1) of the Act, 21 U.S.C. section 360bbb-3(b)(1), unless the authorization is terminated or revoked.  Performed at [facility], [address] [place], [place] [address]   Culture, blood (Routine X 2) w Reflex to ID Panel     Status: None (Preliminary result)   Collection Time: 11/18/20 10:13 AM   Specimen: BLOOD  Result Value Ref Range Status   Specimen Description   Final    BLOOD BLOOD RIGHT HAND Performed at [facility], [address], [place], [place] [address]    Special Requests   Final    BOTTLES DRAWN AEROBIC ONLY Blood Culture adequate volume Performed at [facility], [address] [place], [place] [address]    Culture   Final    NO GROWTH 4 DAYS Performed at [facility], [address] [place], [place] [address]    Report Status PENDING  Incomplete  Culture, blood (Routine X 2) w  Reflex to ID Panel     Status: None (Preliminary result)   Collection Time: 11/18/20 10:18 AM   Specimen: BLOOD  Result Value Ref Range Status   Specimen Description   Final    BLOOD RIGHT ANTECUBITAL Performed at [facility] [address], [place], [place] [address]    Special Requests   Final    BOTTLES DRAWN AEROBIC AND ANAEROBIC Blood Culture adequate volume Performed at [facility], [address] [place], [place] [address]    Culture   Final    NO GROWTH 4 DAYS Performed at [facility], [address] [place], [place] [address]    Report Status PENDING  Incomplete  MRSA Next Gen by PCR, Nasal     Status: None   Collection Time: 11/19/20  7:38 AM   Specimen: Nasal Mucosa; Nasal Swab  Result Value Ref Range Status   MRSA by PCR Next Gen NOT DETECTED NOT DETECTED Final    Comment: (NOTE) The GeneXpert MRSA Assay (FDA approved for NASAL specimens only), is one component of a comprehensive MRSA colonization surveillance program. It is not intended to diagnose MRSA infection nor to guide or monitor treatment for MRSA infections. Test performance is not FDA approved in patients less than 103 years old. Performed at [facility], [address] [place], [place] [address]   Respiratory (~20 pathogens) panel by PCR     Status: None   Collection Time: 11/19/20  9:56 AM   Specimen: Nasopharyngeal Swab; Respiratory  Result Value Ref Range Status   Adenovirus NOT DETECTED NOT DETECTED Final  Coronavirus 229E NOT DETECTED NOT DETECTED Final    Comment: (NOTE) The Coronavirus on the Respiratory Panel, DOES NOT test for the novel  Coronavirus (2019 nCoV)    Coronavirus HKU1 NOT DETECTED NOT DETECTED Final   Coronavirus NL63 NOT DETECTED NOT DETECTED Final   Coronavirus OC43 NOT DETECTED NOT DETECTED Final   Metapneumovirus NOT DETECTED NOT DETECTED Final   Rhinovirus / Enterovirus NOT DETECTED NOT DETECTED Final    Influenza A NOT DETECTED NOT DETECTED Final   Influenza B NOT DETECTED NOT DETECTED Final   Parainfluenza Virus 1 NOT DETECTED NOT DETECTED Final   Parainfluenza Virus 2 NOT DETECTED NOT DETECTED Final   Parainfluenza Virus 3 NOT DETECTED NOT DETECTED Final   Parainfluenza Virus 4 NOT DETECTED NOT DETECTED Final   Respiratory Syncytial Virus NOT DETECTED NOT DETECTED Final   Bordetella pertussis NOT DETECTED NOT DETECTED Final   Bordetella Parapertussis NOT DETECTED NOT DETECTED Final   Chlamydophila pneumoniae NOT DETECTED NOT DETECTED Final   Mycoplasma pneumoniae NOT DETECTED NOT DETECTED Final    Comment: Performed at [facility], [address], [place], [place] [address]     Labs: Basic Metabolic Panel: Recent Labs  Lab 11/18/20 1435 11/19/20 0509 11/20/20 0235 11/21/20 0247 11/22/20 0536  NA 139 136 141 137 138  K 4.3 4.1 3.8 3.7 3.7  CL 105 105 104 100 103  CO2 24 26 27 27 28   GLUCOSE 154* 127* 105* 118* 123*  BUN 30* 26* 27* 33* 29*  CREATININE 0.95 0.99 1.12* 1.17* 1.06*  CALCIUM 9.5 8.8* 9.1 8.9 9.1  PHOS 3.6  --   --   --   --    Liver Function Tests: Recent Labs  Lab 11/18/20 0847 11/18/20 1435 11/19/20 0509  AST 24  --  13*  ALT 12  --  9  ALKPHOS 106  --  72  BILITOT 1.1  --  0.6  PROT 8.3*  --  6.2*  ALBUMIN 4.3 3.9 3.2*   No results for input(s): LIPASE, AMYLASE in the last 168 hours. No results for input(s): AMMONIA in the last 168 hours. CBC: Recent Labs  Lab 11/18/20 0847 11/19/20 0509 11/20/20 0235 11/21/20 0247 11/22/20 0536  WBC 12.3* 9.4 9.0 8.3 7.4  NEUTROABS 9.9*  --  5.1 4.6 4.3  HGB 11.8* 9.0* 10.0* 9.4* 10.1*  HCT 37.5 27.7* 31.4* 28.5* 31.4*  MCV 96.4 95.5 95.4 93.8 93.5  PLT 294 222 225 230 244   Cardiac Enzymes: No results for input(s): CKTOTAL, CKMB, CKMBINDEX, TROPONINI in the last 168 hours. BNP: BNP (last 3 results) Recent Labs    11/18/20 0847  BNP 134.8*    ProBNP (last 3 results) No results for  input(s): PROBNP in the last 8760 hours.  CBG: Recent Labs  Lab 11/21/20 1134 11/21/20 1630 11/21/20 2108 11/22/20 0752 11/22/20 1130  GLUCAP 213* 106* 121* 128* 171*       Signed:  11/24/20, MD [facility] 11/22/2020, 2:14 PM

## 2020-11-22 NOTE — Progress Notes (Signed)
Pt discharged home. AVS printed. Educational teaching completed. VSS. PIV removed. Walker delivered to pt. Pt has all belongings and transported to lobby.

## 2020-11-22 NOTE — Plan of Care (Signed)
Pt vitals stable. Denies pain. Used incentive spirometer achieved 500. Pt encouraged to continue to use.  Problem: Education: Goal: Knowledge of General Education information will improve Description: Including pain rating scale, medication(s)/side effects and non-pharmacologic comfort measures Outcome: Progressing   Problem: Health Behavior/Discharge Planning: Goal: Ability to manage health-related needs will improve Outcome: Progressing   Problem: Clinical Measurements: Goal: Ability to maintain clinical measurements within normal limits will improve Outcome: Progressing Goal: Will remain free from infection Outcome: Progressing Goal: Diagnostic test results will improve Outcome: Progressing Goal: Respiratory complications will improve Outcome: Progressing Goal: Cardiovascular complication will be avoided Outcome: Progressing   Problem: Activity: Goal: Risk for activity intolerance will decrease Outcome: Progressing   Problem: Nutrition: Goal: Adequate nutrition will be maintained Outcome: Progressing   Problem: Coping: Goal: Level of anxiety will decrease Outcome: Progressing   Problem: Elimination: Goal: Will not experience complications related to bowel motility Outcome: Progressing Goal: Will not experience complications related to urinary retention Outcome: Progressing   Problem: Pain Managment: Goal: General experience of comfort will improve Outcome: Progressing   Problem: Safety: Goal: Ability to remain free from injury will improve Outcome: Progressing   Problem: Skin Integrity: Goal: Risk for impaired skin integrity will decrease Outcome: Progressing

## 2020-11-22 NOTE — TOC Transition Note (Signed)
Transition of Care [facility]) - CM/SW Discharge Note   Patient Details  Name: Felicia Davidson MRN: [id number] Date of Birth: April 24, 1945  [facility]) CM/SW Contact:  Lorri Frederick, LCSW Phone Number: 11/22/2020, 2:18 PM   Clinical Narrative:   Pt discharging home with [facility].  Rolling walker needs to be delivered from [employer].  Husband will transport pt home.  No other needs identified.      Final next level of care: Home w Home Health Services Barriers to Discharge: No Barriers Identified   Patient Goals and CMS Choice Patient states their goals for this hospitalization and ongoing recovery are:: "walk faster" CMS Medicare.gov Compare Post Acute Care list provided to:: Patient Choice offered to / list presented to : Patient  Discharge Placement                       Discharge Plan and Services     Post Acute Care Choice: Durable Medical Equipment, Home Health          DME Arranged: Walker rolling DME Agency: [employer] Date DME Agency Contacted: 11/22/20 Time DME Agency Contacted: 1413 Representative spoke with at DME Agency: Leavy Cella HH Arranged: PT HH Agency: [facility] Date [facility] Agency Contacted: 11/22/20 Time HH Agency Contacted: 1413 Representative spoke with at [facility] Agency: Marylene Land  Social Determinants of Health (SDOH) Interventions     Readmission Risk Interventions No flowsheet data found.

## 2020-11-23 LAB — CULTURE, BLOOD (ROUTINE X 2)
Culture: NO GROWTH
Culture: NO GROWTH
Special Requests: ADEQUATE
Special Requests: ADEQUATE

## 2021-06-09 ENCOUNTER — Emergency Department (HOSPITAL_COMMUNITY): Payer: Medicare Other

## 2021-06-09 ENCOUNTER — Inpatient Hospital Stay (HOSPITAL_COMMUNITY): Payer: Medicare Other

## 2021-06-09 ENCOUNTER — Encounter (HOSPITAL_COMMUNITY): Payer: Self-pay | Admitting: Pulmonary Disease

## 2021-06-09 ENCOUNTER — Inpatient Hospital Stay (HOSPITAL_COMMUNITY)
Admission: EM | Admit: 2021-06-09 | Discharge: 2021-06-13 | DRG: 242 | Disposition: A | Discharge status: Home / Self Care | Payer: Medicare Other | Attending: Family Medicine | Admitting: Family Medicine

## 2021-06-09 ENCOUNTER — Other Ambulatory Visit: Payer: Self-pay

## 2021-06-09 ENCOUNTER — Encounter (HOSPITAL_COMMUNITY)
Admission: EM | Disposition: A | Discharge status: Home / Self Care | Payer: Self-pay | Source: Home / Self Care | Attending: Pulmonary Disease

## 2021-06-09 DIAGNOSIS — E785 Hyperlipidemia, unspecified: Secondary | ICD-10-CM | POA: Diagnosis present

## 2021-06-09 DIAGNOSIS — N179 Acute kidney failure, unspecified: Secondary | ICD-10-CM | POA: Diagnosis present

## 2021-06-09 DIAGNOSIS — I5031 Acute diastolic (congestive) heart failure: Secondary | ICD-10-CM | POA: Diagnosis present

## 2021-06-09 DIAGNOSIS — I501 Left ventricular failure: Secondary | ICD-10-CM | POA: Diagnosis present

## 2021-06-09 DIAGNOSIS — Z79899 Other long term (current) drug therapy: Secondary | ICD-10-CM

## 2021-06-09 DIAGNOSIS — I10 Essential (primary) hypertension: Secondary | ICD-10-CM | POA: Diagnosis present

## 2021-06-09 DIAGNOSIS — Z87891 Personal history of nicotine dependence: Secondary | ICD-10-CM | POA: Diagnosis not present

## 2021-06-09 DIAGNOSIS — I452 Bifascicular block: Secondary | ICD-10-CM | POA: Diagnosis present

## 2021-06-09 DIAGNOSIS — I442 Atrioventricular block, complete: Secondary | ICD-10-CM | POA: Diagnosis present

## 2021-06-09 DIAGNOSIS — Z8616 Personal history of COVID-19: Secondary | ICD-10-CM | POA: Diagnosis not present

## 2021-06-09 DIAGNOSIS — I152 Hypertension secondary to endocrine disorders: Secondary | ICD-10-CM | POA: Diagnosis not present

## 2021-06-09 DIAGNOSIS — E875 Hyperkalemia: Secondary | ICD-10-CM | POA: Diagnosis present

## 2021-06-09 DIAGNOSIS — Z6835 Body mass index (BMI) 35.0-35.9, adult: Secondary | ICD-10-CM | POA: Diagnosis not present

## 2021-06-09 DIAGNOSIS — Z880 Allergy status to penicillin: Secondary | ICD-10-CM

## 2021-06-09 DIAGNOSIS — J810 Acute pulmonary edema: Secondary | ICD-10-CM | POA: Diagnosis not present

## 2021-06-09 DIAGNOSIS — E1159 Type 2 diabetes mellitus with other circulatory complications: Secondary | ICD-10-CM

## 2021-06-09 DIAGNOSIS — R570 Cardiogenic shock: Secondary | ICD-10-CM | POA: Diagnosis present

## 2021-06-09 DIAGNOSIS — Z888 Allergy status to other drugs, medicaments and biological substances status: Secondary | ICD-10-CM | POA: Diagnosis not present

## 2021-06-09 DIAGNOSIS — E1140 Type 2 diabetes mellitus with diabetic neuropathy, unspecified: Secondary | ICD-10-CM | POA: Diagnosis present

## 2021-06-09 DIAGNOSIS — D649 Anemia, unspecified: Secondary | ICD-10-CM | POA: Diagnosis present

## 2021-06-09 DIAGNOSIS — E871 Hypo-osmolality and hyponatremia: Secondary | ICD-10-CM | POA: Diagnosis present

## 2021-06-09 DIAGNOSIS — N170 Acute kidney failure with tubular necrosis: Secondary | ICD-10-CM | POA: Diagnosis not present

## 2021-06-09 DIAGNOSIS — Z8701 Personal history of pneumonia (recurrent): Secondary | ICD-10-CM

## 2021-06-09 DIAGNOSIS — Z7984 Long term (current) use of oral hypoglycemic drugs: Secondary | ICD-10-CM

## 2021-06-09 DIAGNOSIS — I110 Hypertensive heart disease with heart failure: Secondary | ICD-10-CM | POA: Diagnosis present

## 2021-06-09 DIAGNOSIS — E119 Type 2 diabetes mellitus without complications: Secondary | ICD-10-CM

## 2021-06-09 HISTORY — DX: Essential (primary) hypertension: I10

## 2021-06-09 HISTORY — DX: Hyperlipidemia, unspecified: E78.5

## 2021-06-09 HISTORY — PX: TEMPORARY PACEMAKER: CATH118268

## 2021-06-09 LAB — CBC WITH DIFFERENTIAL/PLATELET
Abs Immature Granulocytes: 0.05 10*3/uL (ref 0.00–0.07)
Basophils Absolute: 0 10*3/uL (ref 0.0–0.1)
Basophils Relative: 0 %
Eosinophils Absolute: 0.1 10*3/uL (ref 0.0–0.5)
Eosinophils Relative: 1 %
HCT: 29.3 % — ABNORMAL LOW (ref 36.0–46.0)
Hemoglobin: 9.2 g/dL — ABNORMAL LOW (ref 12.0–15.0)
Immature Granulocytes: 0 %
Lymphocytes Relative: 10 %
Lymphs Abs: 1.4 10*3/uL (ref 0.7–4.0)
MCH: 29.9 pg (ref 26.0–34.0)
MCHC: 31.4 g/dL (ref 30.0–36.0)
MCV: 95.1 fL (ref 80.0–100.0)
Monocytes Absolute: 1.1 10*3/uL — ABNORMAL HIGH (ref 0.1–1.0)
Monocytes Relative: 8 %
Neutro Abs: 11.1 10*3/uL — ABNORMAL HIGH (ref 1.7–7.7)
Neutrophils Relative %: 81 %
Platelets: 218 10*3/uL (ref 150–400)
RBC: 3.08 MIL/uL — ABNORMAL LOW (ref 3.87–5.11)
RDW: 13.1 % (ref 11.5–15.5)
WBC: 13.8 10*3/uL — ABNORMAL HIGH (ref 4.0–10.5)
nRBC: 0 % (ref 0.0–0.2)

## 2021-06-09 LAB — MRSA NEXT GEN BY PCR, NASAL: MRSA by PCR Next Gen: NOT DETECTED

## 2021-06-09 LAB — COMPREHENSIVE METABOLIC PANEL
ALT: 11 U/L (ref 0–44)
AST: 14 U/L — ABNORMAL LOW (ref 15–41)
Albumin: 3.3 g/dL — ABNORMAL LOW (ref 3.5–5.0)
Alkaline Phosphatase: 89 U/L (ref 38–126)
Anion gap: 10 (ref 5–15)
BUN: 67 mg/dL — ABNORMAL HIGH (ref 8–23)
CO2: 17 mmol/L — ABNORMAL LOW (ref 22–32)
Calcium: 8.6 mg/dL — ABNORMAL LOW (ref 8.9–10.3)
Chloride: 103 mmol/L (ref 98–111)
Creatinine, Ser: 2.65 mg/dL — ABNORMAL HIGH (ref 0.44–1.00)
GFR, Estimated: 18 mL/min — ABNORMAL LOW (ref >60)
Glucose, Bld: 128 mg/dL — ABNORMAL HIGH (ref 70–99)
Potassium: 6 mmol/L — ABNORMAL HIGH (ref 3.5–5.1)
Sodium: 130 mmol/L — ABNORMAL LOW (ref 135–145)
Total Bilirubin: 0.8 mg/dL (ref 0.3–1.2)
Total Protein: 6.4 g/dL — ABNORMAL LOW (ref 6.5–8.1)

## 2021-06-09 LAB — BASIC METABOLIC PANEL
Anion gap: 11 (ref 5–15)
Anion gap: 10 (ref 5–15)
BUN: 67 mg/dL — ABNORMAL HIGH (ref 8–23)
BUN: 68 mg/dL — ABNORMAL HIGH (ref 8–23)
CO2: 18 mmol/L — ABNORMAL LOW (ref 22–32)
CO2: 19 mmol/L — ABNORMAL LOW (ref 22–32)
Calcium: 9.8 mg/dL (ref 8.9–10.3)
Calcium: 8.8 mg/dL — ABNORMAL LOW (ref 8.9–10.3)
Chloride: 101 mmol/L (ref 98–111)
Chloride: 101 mmol/L (ref 98–111)
Creatinine, Ser: 2.6 mg/dL — ABNORMAL HIGH (ref 0.44–1.00)
Creatinine, Ser: 2.66 mg/dL — ABNORMAL HIGH (ref 0.44–1.00)
GFR, Estimated: 19 mL/min — ABNORMAL LOW (ref >60)
GFR, Estimated: 18 mL/min — ABNORMAL LOW (ref >60)
Glucose, Bld: 141 mg/dL — ABNORMAL HIGH (ref 70–99)
Glucose, Bld: 135 mg/dL — ABNORMAL HIGH (ref 70–99)
Potassium: 5.8 mmol/L — ABNORMAL HIGH (ref 3.5–5.1)
Potassium: 5 mmol/L (ref 3.5–5.1)
Sodium: 130 mmol/L — ABNORMAL LOW (ref 135–145)
Sodium: 130 mmol/L — ABNORMAL LOW (ref 135–145)

## 2021-06-09 LAB — HEMOGLOBIN A1C
Hgb A1c MFr Bld: 5.9 % — ABNORMAL HIGH (ref 4.8–5.6)
Mean Plasma Glucose: 122.63 mg/dL

## 2021-06-09 LAB — CBC
HCT: 29.1 % — ABNORMAL LOW (ref 36.0–46.0)
Hemoglobin: 9.3 g/dL — ABNORMAL LOW (ref 12.0–15.0)
MCH: 29.8 pg (ref 26.0–34.0)
MCHC: 32 g/dL (ref 30.0–36.0)
MCV: 93.3 fL (ref 80.0–100.0)
Platelets: 229 10*3/uL (ref 150–400)
RBC: 3.12 MIL/uL — ABNORMAL LOW (ref 3.87–5.11)
RDW: 13.2 % (ref 11.5–15.5)
WBC: 14.8 10*3/uL — ABNORMAL HIGH (ref 4.0–10.5)
nRBC: 0 % (ref 0.0–0.2)

## 2021-06-09 LAB — ECHOCARDIOGRAM COMPLETE
AR max vel: 1.79 cm2
AV Area VTI: 1.7 cm2
AV Area mean vel: 1.62 cm2
AV Mean grad: 18 mmHg
AV Peak grad: 27.2 mmHg
Ao pk vel: 2.61 m/s
Area-P 1/2: 3.77 cm2
Height: 63 in
P 1/2 time: 876 msec
S' Lateral: 3 cm
Weight: 3184 oz

## 2021-06-09 LAB — URINALYSIS, ROUTINE W REFLEX MICROSCOPIC
Bilirubin Urine: NEGATIVE
Glucose, UA: NEGATIVE mg/dL
Hgb urine dipstick: NEGATIVE
Ketones, ur: NEGATIVE mg/dL
Leukocytes,Ua: NEGATIVE
Nitrite: NEGATIVE
Protein, ur: NEGATIVE mg/dL
Specific Gravity, Urine: 1.006 (ref 1.005–1.030)
pH: 5 (ref 5.0–8.0)

## 2021-06-09 LAB — GLUCOSE, CAPILLARY
Glucose-Capillary: 160 mg/dL — ABNORMAL HIGH (ref 70–99)
Glucose-Capillary: 135 mg/dL — ABNORMAL HIGH (ref 70–99)
Glucose-Capillary: 126 mg/dL — ABNORMAL HIGH (ref 70–99)

## 2021-06-09 LAB — MAGNESIUM: Magnesium: 1.8 mg/dL (ref 1.7–2.4)

## 2021-06-09 LAB — CBG MONITORING, ED: Glucose-Capillary: 138 mg/dL — ABNORMAL HIGH (ref 70–99)

## 2021-06-09 LAB — TROPONIN I (HIGH SENSITIVITY)
Troponin I (High Sensitivity): 16 ng/L (ref <18)
Troponin I (High Sensitivity): 18 ng/L — ABNORMAL HIGH (ref <18)
Troponin I (High Sensitivity): 21 ng/L — ABNORMAL HIGH (ref <18)

## 2021-06-09 LAB — BRAIN NATRIURETIC PEPTIDE: B Natriuretic Peptide: 549 pg/mL — ABNORMAL HIGH (ref 0.0–100.0)

## 2021-06-09 LAB — PROCALCITONIN: Procalcitonin: 0.69 ng/mL

## 2021-06-09 SURGERY — TEMPORARY PACEMAKER
Anesthesia: LOCAL

## 2021-06-09 MED ORDER — DOCUSATE SODIUM 100 MG PO CAPS
100.0000 mg | ORAL_CAPSULE | Freq: Two times a day (BID) | ORAL | Status: DC | PRN
  Administered 2021-06-12: 100 mg via ORAL
  Filled 2021-06-09: qty 1

## 2021-06-09 MED ORDER — LIDOCAINE HCL (PF) 1 % IJ SOLN
INTRAMUSCULAR | Status: AC
  Filled 2021-06-09: qty 30

## 2021-06-09 MED ORDER — HEPARIN SODIUM (PORCINE) 5000 UNIT/ML IJ SOLN
5000.0000 [IU] | Freq: Three times a day (TID) | INTRAMUSCULAR | Status: DC
  Administered 2021-06-09 – 2021-06-10 (×3): 5000 [IU] via SUBCUTANEOUS
  Filled 2021-06-09 (×3): qty 1

## 2021-06-09 MED ORDER — ROSUVASTATIN CALCIUM 5 MG PO TABS
10.0000 mg | ORAL_TABLET | Freq: Every day | ORAL | Status: DC
  Administered 2021-06-09 – 2021-06-13 (×5): 10 mg via ORAL
  Filled 2021-06-09 (×5): qty 2

## 2021-06-09 MED ORDER — SODIUM CHLORIDE 0.9 % IV SOLN: INTRAVENOUS | Status: DC | PRN

## 2021-06-09 MED ORDER — FUROSEMIDE 10 MG/ML IJ SOLN
40.0000 mg | INTRAMUSCULAR | Status: AC
  Administered 2021-06-09: 40 mg via INTRAVENOUS
  Filled 2021-06-09: qty 4

## 2021-06-09 MED ORDER — INSULIN ASPART 100 UNIT/ML IV SOLN
5.0000 [IU] | Freq: Once | INTRAVENOUS | Status: AC
Start: 2021-06-09 — End: 2021-06-09
  Administered 2021-06-09: 5 [IU] via INTRAVENOUS

## 2021-06-09 MED ORDER — DOBUTAMINE IN D5W 4-5 MG/ML-% IV SOLN
5.0000 ug/kg/min | INTRAVENOUS | Status: DC
  Administered 2021-06-09: 7.5 ug/kg/min via INTRAVENOUS
  Administered 2021-06-09 – 2021-06-10 (×2): 5 ug/kg/min via INTRAVENOUS
  Filled 2021-06-09 (×2): qty 250

## 2021-06-09 MED ORDER — ORAL CARE MOUTH RINSE
15.0000 mL | Freq: Two times a day (BID) | OROMUCOSAL | Status: DC
  Administered 2021-06-09 – 2021-06-11 (×6): 15 mL via OROMUCOSAL

## 2021-06-09 MED ORDER — SODIUM ZIRCONIUM CYCLOSILICATE 10 G PO PACK
10.0000 g | PACK | ORAL | Status: AC
  Administered 2021-06-09: 10 g via ORAL
  Filled 2021-06-09: qty 1

## 2021-06-09 MED ORDER — CHLORHEXIDINE GLUCONATE CLOTH 2 % EX PADS
6.0000 | MEDICATED_PAD | Freq: Every day | CUTANEOUS | Status: DC
  Administered 2021-06-09 – 2021-06-12 (×4): 6 via TOPICAL

## 2021-06-09 MED ORDER — DEXTROSE 50 % IV SOLN
12.5000 g | Freq: Once | INTRAVENOUS | Status: AC
  Administered 2021-06-09: 12.5 g via INTRAVENOUS
  Filled 2021-06-09: qty 50

## 2021-06-09 MED ORDER — CALCIUM GLUCONATE-NACL 1-0.675 GM/50ML-% IV SOLN
1.0000 g | Freq: Once | INTRAVENOUS | Status: AC
  Administered 2021-06-09: 1000 mg via INTRAVENOUS
  Filled 2021-06-09: qty 50

## 2021-06-09 MED ORDER — SODIUM BICARBONATE 8.4 % IV SOLN
50.0000 meq | Freq: Once | INTRAVENOUS | Status: AC
Start: 2021-06-09 — End: 2021-06-09
  Administered 2021-06-09: 50 meq via INTRAVENOUS
  Filled 2021-06-09: qty 50

## 2021-06-09 MED ORDER — ACETAMINOPHEN 325 MG PO TABS
650.0000 mg | ORAL_TABLET | Freq: Four times a day (QID) | ORAL | Status: DC | PRN
  Administered 2021-06-09 – 2021-06-12 (×3): 650 mg via ORAL
  Filled 2021-06-09 (×3): qty 2

## 2021-06-09 MED ORDER — POLYETHYLENE GLYCOL 3350 17 G PO PACK: 17.0000 g | PACK | Freq: Every day | ORAL | Status: DC | PRN

## 2021-06-09 MED ORDER — INSULIN ASPART 100 UNIT/ML IJ SOLN
0.0000 [IU] | INTRAMUSCULAR | Status: DC
  Administered 2021-06-09 (×3): 1 [IU] via SUBCUTANEOUS
  Administered 2021-06-10: 2 [IU] via SUBCUTANEOUS
  Administered 2021-06-11: 1 [IU] via SUBCUTANEOUS
  Administered 2021-06-11: 5 [IU] via SUBCUTANEOUS
  Administered 2021-06-11: 1 [IU] via SUBCUTANEOUS
  Administered 2021-06-12 (×2): 2 [IU] via SUBCUTANEOUS
  Administered 2021-06-12: 1 [IU] via SUBCUTANEOUS

## 2021-06-09 SURGICAL SUPPLY — 11 items
CABLE ADAPT PACING TEMP 12FT (ADAPTER) ×1 IMPLANT
CATH S G BIP PACING (CATHETERS) ×1 IMPLANT
ELECT DEFIB PAD ADLT CADENCE (PAD) ×1 IMPLANT
KIT MICROPUNCTURE NIT STIFF (SHEATH) ×1 IMPLANT
PACK CARDIAC CATHETERIZATION (CUSTOM PROCEDURE TRAY) ×1 IMPLANT
PROTECTION STATION PRESSURIZED (MISCELLANEOUS) ×2
SHEATH PINNACLE 6F 10CM (SHEATH) ×1 IMPLANT
SHEATH PROBE COVER 6X72 (BAG) ×1 IMPLANT
SLEEVE REPOSITIONING LENGTH 30 (MISCELLANEOUS) ×1 IMPLANT
STATION PROTECTION PRESSURIZED (MISCELLANEOUS) IMPLANT
TRANSDUCER W/STOPCOCK (MISCELLANEOUS) ×1 IMPLANT

## 2021-06-09 NOTE — ED Provider Notes (Addendum)
[facility] EMERGENCY DEPARTMENT Provider Note   CSN: [id number] Arrival date & time: 06/09/21  1751     History  Chief Complaint  Patient presents with   Shortness of Breath   Bradycardia    Felicia Davidson is a 76 y.o. female.   Shortness of Breath  This patient is a 76 year old female with a history of hypertension on amlodipine and olmesartan, and carvedilol, she also takes Lasix and is known to have diabetes taking glipizide.  She takes rosuvastatin for cholesterol  The patient has had approximately 4 days of feeling short of breath, she was actually seen at her doctor's office yesterday, they were unable to get blood work, they noted that her heart rate was in the low 40s to 50 and asked her to come back today since they could not get blood work done.  The patient presents today with increasing shortness of breath by paramedic transport from her home, she has not had any coughing but is felt edematous, she feels more short of breath with any exertion, she has no chest pain or fevers.  She has no prior history of cardiac disease that she knows of  Home Medications Prior to Admission medications   Medication Sig Start Date End Date Taking? Authorizing Provider  amLODipine (NORVASC) 10 MG tablet Take 10 mg by mouth daily. 10/13/20   [provider]  Calcium Carbonate (CALCIUM 600 PO) Take 1 tablet by mouth 2 (two) times daily. With Minerals 2 times daily    [provider]  carvedilol (COREG) 25 MG tablet Take 0.5 tablets (12.5 mg total) by mouth 2 (two) times daily. 11/22/20   Briant Cedar, MD  ferrous sulfate 325 (65 FE) MG tablet Take 325 mg by mouth daily with breakfast.     [provider]  furosemide (LASIX) 20 MG tablet Take 1 tablet (20 mg total) by mouth daily. 11/22/20 12/22/20  Briant Cedar, MD  glipiZIDE (GLUCOTROL) 10 MG tablet Take 5 mg by mouth 2 (two) times daily before a meal. 03/18/15   [provider]  metFORMIN (GLUCOPHAGE) 1000 MG tablet Take 1,000 mg by mouth 2 (two) times daily with a meal.    [provider]  olmesartan (BENICAR) 40 MG tablet Take 40 mg by mouth every evening. 10/12/20   [provider]  rosuvastatin (CRESTOR) 10 MG tablet Take 10 mg by mouth daily.  03/18/15   [provider]  vitamin B-12 (CYANOCOBALAMIN) 500 MCG tablet Take 500 mcg by mouth daily.    [provider]      Allergies    Lipitor [atorvastatin] and Penicillins    Review of Systems   Review of Systems  Respiratory:  Positive for shortness of breath.   All other systems reviewed and are negative.  Physical Exam Updated Vital Signs BP (!) 167/46   Pulse (!) 37   Temp 98 F (36.7 C) (Oral)   Resp 19   Ht 1.6 m (5\' 3" )   Wt 90.3 kg   SpO2 93%   BMI 35.25 kg/m  Physical Exam Vitals and nursing note reviewed.  Constitutional:      General: She is not in acute distress.    Appearance: She is well-developed.  HENT:     Head: Normocephalic and atraumatic.     Mouth/Throat:     Pharynx: No oropharyngeal exudate.  Eyes:     General: No scleral icterus.       Right eye: No discharge.  Left eye: No discharge.     Conjunctiva/sclera: Conjunctivae normal.     Pupils: Pupils are equal, round, and reactive to light.  Neck:     Thyroid: No thyromegaly.     Vascular: No JVD.  Cardiovascular:     Rate and Rhythm: Regular rhythm. Bradycardia present.     Heart sounds: Normal heart sounds. No murmur heard.   No friction rub. No gallop.  Pulmonary:     Effort: Pulmonary effort is normal. No respiratory distress.     Breath sounds: Wheezing present. No rales.  Abdominal:     General: Bowel sounds are normal. There is no distension.     Palpations: Abdomen is soft. There is no mass.     Tenderness: There is no abdominal tenderness.  Musculoskeletal:        General: No tenderness. Normal range of motion.     Cervical back: Normal range of  motion and neck supple.     Right lower leg: Edema present.     Left lower leg: Edema present.  Lymphadenopathy:     Cervical: No cervical adenopathy.  Skin:    General: Skin is warm and dry.     Findings: No erythema or rash.  Neurological:     Mental Status: She is alert.     Coordination: Coordination normal.  Psychiatric:        Behavior: Behavior normal.    ED Results / Procedures / Treatments   Labs (all labs ordered are listed, but only abnormal results are displayed) Labs Reviewed  CBC WITH DIFFERENTIAL/PLATELET - Abnormal; Notable for the following components:      Result Value   WBC 13.8 (*)    RBC 3.08 (*)    Hemoglobin 9.2 (*)    HCT 29.3 (*)    Neutro Abs 11.1 (*)    Monocytes Absolute 1.1 (*)    All other components within normal limits  COMPREHENSIVE METABOLIC PANEL  BRAIN NATRIURETIC PEPTIDE  TROPONIN I (HIGH SENSITIVITY)    EKG EKG Interpretation  Date/Time:  Wednesday Jun 09 2021 10:00:38 EDT Ventricular Rate:  55 PR Interval:    QRS Duration: 124 QT Interval:  499 QTC Calculation: 478 R Axis:   -54 Text Interpretation: AV block, complete (third degree) Right bundle branch block Left ventricular hypertrophy Inferior infarct, old Since last tracing Left bundle branch block has been replaced by CHB Confirmed by Eber Hong ([id number]) on 06/09/2021 10:27:15 AM  Radiology DG Chest Port 1 View  Result Date: 06/09/2021 CLINICAL DATA:  Shortness of breath and weakness.  Bradycardia. EXAM: PORTABLE CHEST 1 VIEW COMPARISON:  11/19/2020. FINDINGS: Trachea is midline. Heart size stable. Defibrillator pads project over the left mid chest and epigastric region. Mild interstitial prominence and indistinctness bilaterally. No dense airspace consolidation or definite pleural fluid. IMPRESSION: Mild interstitial prominence and indistinctness, suspicious for pulmonary edema. Electronically Signed   By: Leanna Battles M.D.   On: 06/09/2021 10:15     Procedures .Critical Care Performed by: Eber Hong, MD Authorized by: Eber Hong, MD   Critical care provider statement:    Critical care time (minutes):  45   Critical care time was exclusive of:  Separately billable procedures and treating other patients and teaching time   Critical care was necessary to treat or prevent imminent or life-threatening deterioration of the following conditions:  Cardiac failure   Critical care was time spent personally by me on the following activities:  Development of treatment plan with patient or surrogate,  discussions with consultants, evaluation of patient's response to treatment, examination of patient, ordering and review of laboratory studies, ordering and review of radiographic studies, ordering and performing treatments and interventions, pulse oximetry, re-evaluation of patient's condition, review of old charts and obtaining history from patient or surrogate   I assumed direction of critical care for this patient from another provider in my specialty: no     Care discussed with: admitting provider   Comments:          Medications Ordered in ED Medications - No data to display  ED Course/ Medical Decision Making/ A&P                           Medical Decision Making Amount and/or Complexity of Data Reviewed Labs: ordered. Radiology: ordered. ECG/medicine tests: ordered.  Risk Prescription drug management. Decision regarding hospitalization.   This patient presents to the ED for concern of shortness of breath, this involves an extensive number of treatment options, and is a complaint that carries with it a high risk of complications and morbidity.  The differential diagnosis includes congestive heart failure, ischemia, the patient is most certainly in complete heart block based on the prehospital EKGs   Co morbidities that complicate the patient evaluation  Hypertension on a beta-blocker Diabetes   Additional history  obtained:  Additional history obtained from paramedics External records from outside source obtained and reviewed including prehospital cardiac tracings which show complete heart block with a rate around 30   Lab Tests:  I Ordered, and personally interpreted labs.  The pertinent results include: CBC shows a leukocytosis of 13,000 and a mild anemia of 9.2 which seems to be close to baseline.   Imaging Studies ordered:  I ordered imaging studies including portable chest x-ray I independently visualized and interpreted imaging which showed acute pulmonary edema I agree with the radiologist interpretation   Cardiac Monitoring: / EKG:  The patient was maintained on a cardiac monitor.  I personally viewed and interpreted the cardiac monitored which showed an underlying rhythm of: Bradycardia appears to be complete heart block   Consultations Obtained:  I requested consultation with the cardiologist Dr. Rosemary Holms,  and discussed lab and imaging findings as well as pertinent plan - they recommend: admit to hospital - he will come to see patient for pacemaker placement. Consultation with critical care, requested they admit the patient to the ICU  Brett Canales Minor will have someone come admit to ICU.   Problem List / ED Course / Critical interventions / Medication management  Acute pulmonary edema as well as complete heart block, and acute kidney injury which is likely secondary to the complete heart block I ordered medication including Lasix for pulmonary edema Reevaluation of the patient after these medicines showed that the patient stable, needing constant cardiac monitoring and pacemaker pads to the bedside in case she becomes more bradycardic or goes hypotensive Cardiology has seen patient and because of acute kidney injury with hyperkalemia they have requested that patient be admitted to the ICU.  They will work on getting electrophysiology involved for temporary pacemaker, they request  holding beta-blocker agents, they agree with diuresis I have reviewed the patients home medicines and have made adjustments as needed   Social Determinants of Health:  Patient is chronically ill, morbidly obese and has significant pulmonary edema, no cardiologist   Test / Admission - Considered:  We will need to be admitted by cardiology  Final Clinical Impression(s) / ED Diagnoses Final diagnoses:  Complete heart block (HCC)  Acute pulmonary edema (HCC)   06/09/21 1048    Eber Hong, MD 06/09/21 1144

## 2021-06-09 NOTE — ED Triage Notes (Signed)
Pt from home, sob and weakness x 2 days. Pt went to PCP, they noticed a low heart rate, did some blood work, and sent her home yesterday, told her to come back today since they couldn't get all the blood work at that time. Pt found to be in 3rd degree heart block by EMS.

## 2021-06-09 NOTE — Assessment & Plan Note (Signed)
-   Hold home medications as may be contributing to hyperkalemia, bradycardia.  - Allow medication washout prior to committing to PPM.

## 2021-06-09 NOTE — Assessment & Plan Note (Signed)
HBa1c 5.9%  - hold oral agents in presence of renal failure - monitor for hypoglycemia.

## 2021-06-09 NOTE — Assessment & Plan Note (Addendum)
Relatively abrupt onset of dyspnea on exertion.  EKG 6 months ago already showed prolonged PR and LBBB.   Presently she is comfortable and sensorium is clear, but she is pale on examination, JVP is elevated and there are crackles at the bases and capillary refill is prolonged.  Telemetry review shows sinus bradycardia alternating with CHB  Echo shows low normal EF with LBBB related dyssynchrony. MR ++, AI + pseudo-normal filling pattern. TR++, RV normal  - dobutamine to increase HR, titrate to keep HR >70 - needs PPM +/- bridging temporary wire.

## 2021-06-09 NOTE — Progress Notes (Signed)
  Echocardiogram 2D Echocardiogram has been performed.  Felicia Davidson M 06/09/2021, 2:12 PM

## 2021-06-09 NOTE — Consult Note (Addendum)
CARDIOLOGY CONSULT NOTE  Patient ID: Felicia Davidson MRN: [id number] DOB/AGE: [birth year] 76 y.o.  Admit date: 06/09/2021 Referring Physician: [facility] Reason for Consultation:  Shortness of breath, complete heart block  HPI:   76 y.o. Caucasian female  with diabetes, neuropathy, anemia, admitted with shortness of breath  Patient is here in [place], ER with her husband today.  She has been feeling tired and fatigued for last several days.  For last 3-4 days, she has had particular worsening of her dyspnea with minimal exertion, coincided with bilateral leg swelling.  She was evaluated by PCP Dr. Jeannetta Nap, and sent to the ER emergently.  Work-up in the ER thus far shows complete heart block with ventricular rate in 30s, stable blood pressure.  At baseline, patient is on carvedilol 12.5 mg twice daily, and amlodipine 10 mg daily.  Creatinine increased to 2.6, potassium of 6.0, sodium at 130.  Patient had pneumonia in November 2022, and COVID in January 2023.  Ever since, she is felt tired and fatigued.  However, she has noticed progressively worsening dyspnea on exertion for last few weeks.  She denies any chest pain.  She had 1 syncopal episode in January 2023 which was attributed to hypoglycemia.  Other than that, she has not had any syncopal episodes.  Past Medical History:  Diagnosis Date   Anemia    Diabetes (HCC)    Neuropathy      Past Surgical History:  Procedure Laterality Date   COMBINED HYSTEROSCOPY DIAGNOSTIC / D&C        Family History  Problem Relation Age of Onset   Anemia Mother    CAD Father    Lung disease Maternal Grandfather      Social History: Social History   Socioeconomic History   Marital status: Married    Spouse name: Not on file   Number of children: Not on file   Years of education: Not on file   Highest education level: Not on file  Occupational History   Not on file  Tobacco Use   Smoking status: Former   Smokeless tobacco: Never   Vaping Use   Vaping Use: Never used  Substance and Sexual Activity   Alcohol use: No    Alcohol/week: 0.0 standard drinks   Drug use: No   Sexual activity: Not on file  Other Topics Concern   Not on file  Social History Narrative   Not on file   Social Determinants of Health   Financial Resource Strain: Not on file  Food Insecurity: Not on file  Transportation Needs: Not on file  Physical Activity: Not on file  Stress: Not on file  Social Connections: Not on file  Intimate Partner Violence: Not on file     (Not in a hospital admission)   Review of Systems  Constitutional: Positive for malaise/fatigue and weight gain.  Cardiovascular:  Positive for dyspnea on exertion and leg swelling. Negative for chest pain, palpitations and syncope.     Physical Exam: Physical Exam Vitals and nursing note reviewed.  Constitutional:      General: She is not in acute distress. Neck:     Vascular: JVD present.  Cardiovascular:     Rate and Rhythm: Normal rate and regular rhythm.     Heart sounds: Normal heart sounds. No murmur heard. Pulmonary:     Effort: Pulmonary effort is normal.     Breath sounds: Examination of the right-lower field reveals rales. Examination of the left-lower field reveals rales.  Rales present. No wheezing.  Musculoskeletal:     Right lower leg: Edema (3+) present.     Left lower leg: Edema (3+) present.       Imaging/tests reviewed and independently interpreted: Lab Results: CBC, BMP  Cardiac Studies:  Telemetry 06/09/2021: Third degree AV block      EKG 06/09/2021: Sinus rhythm Third degree AV block Junctional escape rhythm with underlying RBBB, LAFB  EKG 11/18/2020: Sinus rhythm First degree AV block LBBB  Echocardiogram 06/09/2021: Ordered and pending  Echocardiogram 11/19/2020:  1. Left ventricular ejection fraction, by estimation, is 60 to 65%. The  left ventricle has normal function. The left ventricle has no regional  wall  motion abnormalities. There is mild concentric left ventricular  hypertrophy. Left ventricular diastolic  parameters are consistent with Grade I diastolic dysfunction (impaired  relaxation).   2. Right ventricular systolic function is normal. The right ventricular  size is normal. There is normal pulmonary artery systolic pressure.   3. The mitral valve is normal in structure. Mild mitral valve  regurgitation. No evidence of mitral stenosis.   4. The aortic valve is normal in structure. Aortic valve regurgitation is  not visualized. Mild aortic valve sclerosis is present, with no evidence  of aortic valve stenosis.   5. The inferior vena cava is normal in size with greater than 50%  respiratory variability, suggesting right atrial pressure of 3 mmHg.     Assessment & Recommendations:  76 y.o. 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 May use hydralazine for BP control for SBP >160 mmHg.  If no correction in the rhythm, will try dopamine. If no improvement, pending coreg and amlodipine washout, will place temporary pacemaker to avoid further end organ dysfunction.  Schedule for temporary pacemaker placement. We discussed regarding risks, benefits, alternatives. Understands <1-2% risk of death, bleeding, infection, lung collapse, pneumothorax, cardiac perforation, but not limited to these. Patient wants to proceed.    Shortness of breath: Acute heart failure, possible systolic or driven by complete AV block BNP 549, trop  normal Cautious diuresis is reasonable. May use IV lasix 40 mg bid for now.  AKI: Likely due to complete AV block  Given metabolic dysfunction and end organ damage, recommend critical care admission. I will continue to follow along after temp pacer placement. I will also get EP consult, with anticipation of permanent pacemaker in the next few days   Discussed interpretation of tests and management recommendations with the primary team  CRITICAL CARE Performed by: Truett Mainland   Total critical care time: 40 minutes   Critical care time was exclusive of separately billable procedures and treating other patients.   Critical care was necessary to treat or prevent imminent or life-threatening deterioration.   Critical care was time spent personally by me on the following activities: development of treatment plan with patient and/or surrogate as well as nursing, discussions with consultants, evaluation of patient's response to treatment, examination of patient, obtaining history from patient or surrogate, ordering and performing treatments and interventions, ordering and review of laboratory studies, ordering and review of radiographic studies,  pulse oximetry and re-evaluation of patient's condition.        Elder Negus, MD Pager: [phone] Office: [phone]

## 2021-06-09 NOTE — Assessment & Plan Note (Addendum)
Secondary to renal hypoperfusion.   -increase heart rate to improve renal perfusion - correct hyperkalemia.

## 2021-06-09 NOTE — H&P (Signed)
NAMEPatrise Davidson, MRN:  [id number], DOB:  04/03/1945, LOS: 0 ADMISSION DATE:  06/09/2021, CONSULTATION DATE:  5/31 REFERRING MD:  Felicia Davidson, CHIEF COMPLAINT:  complete heart block   History of Present Illness:  76 year old female with prior history of HTN, HLD, DMT2, and anemia presenting from home with 3-4 day history of progressive exertional shortness of breath, weakness, and lower leg swelling.  She was seen by her PCP yesterday for same, noted HR in 40-50's and they were unable to obtain blood work.  She was supposed to return today for repeat attempt at blood work but given her progressive symptoms, patient called EMS.   Denies any recent chest pain, fevers, or cough/ sputum production.   Was found to be in complete heart block with EMS.  She takes norvasc, coreg, lasix, and olmesartan at home.  In ER, continues to be in complete heart block with ventricular rate in the 30's, however currently maintaining blood pressure, 167/46 with saturations 93% on room air, afebrile and noted to have 3+ pitting lower extremity edema.   Workup significant for Na 130, K 6, bicarb 17, BUN 67, sCr 2.65, BNP 549, trop hs 16, WBC 13.8, Hgb 9.2, CXR consistent with mild pulmonary edema.  She was treated with lasix, bicarb, calcium gluconate, and lokelmia in ER.  Felicia Davidson consulted with cardiology with plans for temporary pacemaker.  PCCM called for ICU admit.    Pertinent  Medical History   Former smoker, HTN, DMT2, neuropathy, anemia, HLD, COVID 01/2021  Significant Hospital Events: Including procedures, antibiotic start and stop dates in addition to other pertinent events   5/31 admit with CHB, AKI, acute HF, hyperkalemia> temp pacer  Interim History / Subjective:  Patient AO in NAD HR 30-40s Sats 95% on 3L [place]  Objective   Blood pressure 133/76, pulse (!) 36, temperature 98 F (36.7 C), temperature source Oral, resp. rate 14, height 5\' 3"  (1.6 m), weight 90.3 kg, SpO2 95 %.       No  intake or output data in the 24 hours ending 06/09/21 1136 Filed Weights   06/09/21 1003  Weight: 90.3 kg    Examination: General:  Elderly appearing female in NAD HEENT: MM pink/moist; [place] in place; JVD present Neuro: Aox3; MAE CV: s1s2, brady 30s, no m/r/g PULM:  dim clear BS bilaterally; [place] 3L GI: soft, bsx4 active  Extremities: warm/dry, 3+ BLE edema  Skin: no rashes or lesions appreciated   Resolved Hospital Problem list     Assessment & Plan:  Complete heart block Hyperkalemia Elevated BNP P: -cardiology consulted; appreciate recs -admit to icu w/ continuous telemetry monitoring -consider temp Pacemaker per cards; EP consult -will start on dobutamine -given calcium gluconate, insulin/dextrose, bicarb, lokelma, lasix -check mag and repeat bmp later today; trend electrolytes and replete as needed -trend troponin -avoid BB -check echo  Pulmonary Edema: likely related to Acute CHF related to heart block P: -wean [place] for sats >92% -pulm toiletry: IS -IV lasix; trend UOP -trend CXR  AKI: baseline creat 1.06-1.17 6 months ago P: -Trend BMP / urinary output -Replace electrolytes as indicated -Avoid nephrotoxic agents, ensure adequate renal perfusion  Hyponatremia: likely hypervolemia  P: -IV lasix -trend bmp -consider further work up  Leukocytosis: reactive? P: -trend wbc/fever curve -check PCT; consider abx if elevated -check UA/UC  Hx of HTN: on carvedilol, olmesartan, lasix, and norvasc at home Hx of HLD -Hold home BB; hold home ARB in setting of hyperkalemia and AKI -  giving IV lasix -consider resuming home norvasc and tomorrow in am -continue statin  DMT2 P: -check a1c -ssi and cbg monitoring  Chronic anemia P: -trend cbc  Best Practice (right click and "Reselect all SmartList Selections" daily)   Diet/type: NPO until receives temp pacemaker DVT prophylaxis: prophylactic heparin  GI prophylaxis: N/A Lines: N/A Foley:  N/A Code Status:   full code Last date of multidisciplinary goals of care discussion [5/31 spoke with Felicia Davidson and Felicia Davidson at bedside. Decided that patient would want full code for now.]  Labs   CBC: Recent Labs  Lab 06/09/21 1004  WBC 13.8*  NEUTROABS 11.1*  HGB 9.2*  HCT 29.3*  MCV 95.1  PLT 99991111    Basic Metabolic Panel: Recent Labs  Lab 06/09/21 1004  NA 130*  K 6.0*  CL 103  CO2 17*  GLUCOSE 128*  BUN 67*  CREATININE 2.65*  CALCIUM 8.6*   GFR: Estimated Creatinine Clearance: 19.6 mL/min (A) (by C-G formula based on SCr of 2.65 mg/dL (H)). Recent Labs  Lab 06/09/21 1004  WBC 13.8*    Liver Function Tests: Recent Labs  Lab 06/09/21 1004  AST 14*  ALT 11  ALKPHOS 89  BILITOT 0.8  PROT 6.4*  ALBUMIN 3.3*   No results for input(s): LIPASE, AMYLASE in the last 168 hours. No results for input(s): AMMONIA in the last 168 hours.  ABG    Component Value Date/Time   PHART 7.375 11/19/2020 0733   PCO2ART 43.0 11/19/2020 0733   PO2ART 104 11/19/2020 0733   HCO3 24.5 11/19/2020 0733   ACIDBASEDEF 0.2 11/19/2020 0733   O2SAT 98.2 11/19/2020 0733     Coagulation Profile: No results for input(s): INR, PROTIME in the last 168 hours.  Cardiac Enzymes: No results for input(s): CKTOTAL, CKMB, CKMBINDEX, TROPONINI in the last 168 hours.  HbA1C: Hgb A1c MFr Bld  Date/Time Value Ref Range Status  11/18/2020 02:30 PM 5.8 (H) 4.8 - 5.6 % Final    Comment:    (NOTE) Pre diabetes:          5.7%-6.4%  Diabetes:              >6.4%  Glycemic control for   <7.0% adults with diabetes     CBG: No results for input(s): GLUCAP in the last 168 hours.  Review of Systems:   Review of Systems  Constitutional:  Positive for malaise/fatigue. Negative for chills and fever.  Respiratory:  Positive for shortness of breath. Negative for cough and wheezing.   Cardiovascular:  Positive for leg swelling. Negative for chest pain.  Gastrointestinal:  Negative for diarrhea, nausea and vomiting.     Past Medical History:  She,  has a past medical history of Anemia, Diabetes ([place]), and Neuropathy.   Surgical History:   Past Surgical History:  Procedure Laterality Date   COMBINED HYSTEROSCOPY DIAGNOSTIC / D&C       Social History:   reports that she has quit smoking. She has never used smokeless tobacco. She reports that she does not drink alcohol and does not use drugs.   Family History:  Her family history includes Anemia in her mother; CAD in her father; Lung disease in her maternal grandfather.   Allergies Allergies  Allergen Reactions   Lipitor [Atorvastatin] Other (See Comments)    Sore muscles   Penicillins Other (See Comments)    Stiff joints     Home Medications  Prior to Admission medications   Medication Sig Start Date End Date  Taking? Authorizing Provider  amLODipine (NORVASC) 10 MG tablet Take 10 mg by mouth daily. 10/13/20  Yes [provider]  Calcium Carbonate (CALCIUM 600 PO) Take 1 tablet by mouth 2 (two) times daily. With Minerals 2 times daily   Yes [provider]  carvedilol (COREG) 25 MG tablet Take 0.5 tablets (12.5 mg total) by mouth 2 (two) times daily. 11/22/20  Yes Alma Friendly, MD  ferrous sulfate 325 (65 FE) MG tablet Take 325 mg by mouth daily with breakfast.    Yes [provider]  glipiZIDE (GLUCOTROL) 10 MG tablet Take 5 mg by mouth 2 (two) times daily before a meal. 03/18/15  Yes [provider]  metFORMIN (GLUCOPHAGE) 1000 MG tablet Take 1,000 mg by mouth 2 (two) times daily with a meal.   Yes [provider]  olmesartan (BENICAR) 40 MG tablet Take 40 mg by mouth every evening. 10/12/20  Yes [provider]  rosuvastatin (CRESTOR) 10 MG tablet Take 10 mg by mouth daily.  03/18/15  Yes [provider]  vitamin B-12 (CYANOCOBALAMIN) 500 MCG tablet Take 500 mcg by mouth daily.   Yes [provider]  furosemide (LASIX) 20 MG tablet Take 1 tablet (20 mg total) by  mouth daily. 11/22/20 12/22/20  Alma Friendly, MD     Critical care time: 45 minutes    JD Rexene Agent [facility] 06/09/2021, 11:36 AM  Please see [email or web address] for pager details.  From 7A-7P if no response, please call [phone]. After hours, please call ELink [phone].

## 2021-06-09 NOTE — H&P (View-Only) (Signed)
CARDIOLOGY CONSULT NOTE  Patient ID: Felicia Davidson MRN: [id number] DOB/AGE: [birth year] 76 y.o.  Admit date: 06/09/2021 Referring Physician: [facility] Reason for Consultation:  Shortness of breath, complete heart block  HPI:   76 y.o. Caucasian female  with diabetes, neuropathy, anemia, admitted with shortness of breath  Patient is here in [place], ER with her husband today.  She has been feeling tired and fatigued for last several days.  For last 3-4 days, she has had particular worsening of her dyspnea with minimal exertion, coincided with bilateral leg swelling.  She was evaluated by PCP Dr. Jeannetta Nap, and sent to the ER emergently.  Work-up in the ER thus far shows complete heart block with ventricular rate in 30s, stable blood pressure.  At baseline, patient is on carvedilol 12.5 mg twice daily, and amlodipine 10 mg daily.  Creatinine increased to 2.6, potassium of 6.0, sodium at 130.  Patient had pneumonia in November 2022, and COVID in January 2023.  Ever since, she is felt tired and fatigued.  However, she has noticed progressively worsening dyspnea on exertion for last few weeks.  She denies any chest pain.  She had 1 syncopal episode in January 2023 which was attributed to hypoglycemia.  Other than that, she has not had any syncopal episodes.  Past Medical History:  Diagnosis Date   Anemia    Diabetes (HCC)    Neuropathy      Past Surgical History:  Procedure Laterality Date   COMBINED HYSTEROSCOPY DIAGNOSTIC / D&C        Family History  Problem Relation Age of Onset   Anemia Mother    CAD Father    Lung disease Maternal Grandfather      Social History: Social History   Socioeconomic History   Marital status: Married    Spouse name: Not on file   Number of children: Not on file   Years of education: Not on file   Highest education level: Not on file  Occupational History   Not on file  Tobacco Use   Smoking status: Former   Smokeless tobacco: Never   Vaping Use   Vaping Use: Never used  Substance and Sexual Activity   Alcohol use: No    Alcohol/week: 0.0 standard drinks   Drug use: No   Sexual activity: Not on file  Other Topics Concern   Not on file  Social History Narrative   Not on file   Social Determinants of Health   Financial Resource Strain: Not on file  Food Insecurity: Not on file  Transportation Needs: Not on file  Physical Activity: Not on file  Stress: Not on file  Social Connections: Not on file  Intimate Partner Violence: Not on file     (Not in a hospital admission)   Review of Systems  Constitutional: Positive for malaise/fatigue and weight gain.  Cardiovascular:  Positive for dyspnea on exertion and leg swelling. Negative for chest pain, palpitations and syncope.     Physical Exam: Physical Exam Vitals and nursing note reviewed.  Constitutional:      General: She is not in acute distress. Neck:     Vascular: JVD present.  Cardiovascular:     Rate and Rhythm: Normal rate and regular rhythm.     Heart sounds: Normal heart sounds. No murmur heard. Pulmonary:     Effort: Pulmonary effort is normal.     Breath sounds: Examination of the right-lower field reveals rales. Examination of the left-lower field reveals rales.  Rales present. No wheezing.  Musculoskeletal:     Right lower leg: Edema (3+) present.     Left lower leg: Edema (3+) present.       Imaging/tests reviewed and independently interpreted: Lab Results: CBC, BMP  Cardiac Studies:  Telemetry 06/09/2021: Third degree AV block      EKG 06/09/2021: Sinus rhythm Third degree AV block Junctional escape rhythm with underlying RBBB, LAFB  EKG 11/18/2020: Sinus rhythm First degree AV block LBBB  Echocardiogram 06/09/2021: Ordered and pending  Echocardiogram 11/19/2020:  1. Left ventricular ejection fraction, by estimation, is 60 to 65%. The  left ventricle has normal function. The left ventricle has no regional  wall  motion abnormalities. There is mild concentric left ventricular  hypertrophy. Left ventricular diastolic  parameters are consistent with Grade I diastolic dysfunction (impaired  relaxation).   2. Right ventricular systolic function is normal. The right ventricular  size is normal. There is normal pulmonary artery systolic pressure.   3. The mitral valve is normal in structure. Mild mitral valve  regurgitation. No evidence of mitral stenosis.   4. The aortic valve is normal in structure. Aortic valve regurgitation is  not visualized. Mild aortic valve sclerosis is present, with no evidence  of aortic valve stenosis.   5. The inferior vena cava is normal in size with greater than 50%  respiratory variability, suggesting right atrial pressure of 3 mmHg.     Assessment & Recommendations:  76 y.o. 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 May use hydralazine for BP control for SBP >160 mmHg.  If no correction in the rhythm, will try dopamine. If no improvement, pending coreg and amlodipine washout, will place temporary pacemaker to avoid further end organ dysfunction.  Schedule for temporary pacemaker placement. We discussed regarding risks, benefits, alternatives. Understands <1-2% risk of death, bleeding, infection, lung collapse, pneumothorax, cardiac perforation, but not limited to these. Patient wants to proceed.    Shortness of breath: Acute heart failure, possible systolic or driven by complete AV block BNP 549, trop  normal Cautious diuresis is reasonable. May use IV lasix 40 mg bid for now.  AKI: Likely due to complete AV block  Given metabolic dysfunction and end organ damage, recommend critical care admission. I will continue to follow along after temp pacer placement. I will also get EP consult, with anticipation of permanent pacemaker in the next few days   Discussed interpretation of tests and management recommendations with the primary team  CRITICAL CARE Performed by: Kolden Dupee   Total critical care time: 40 minutes   Critical care time was exclusive of separately billable procedures and treating other patients.   Critical care was necessary to treat or prevent imminent or life-threatening deterioration.   Critical care was time spent personally by me on the following activities: development of treatment plan with patient and/or surrogate as well as nursing, discussions with consultants, evaluation of patient's response to treatment, examination of patient, obtaining history from patient or surrogate, ordering and performing treatments and interventions, ordering and review of laboratory studies, ordering and review of radiographic studies,   pulse oximetry and re-evaluation of patient's condition.        Elder Negus, MD Pager: [phone] Office: [phone]

## 2021-06-09 NOTE — Progress Notes (Addendum)
eLink Physician-Brief Progress Note Patient Name: Felicia Davidson DOB: 1945-11-29 MRN: [id number]   Date of Service  06/09/2021  HPI/Events of Note  Groin discomfort - Request for Tylenol. AST and ALT both normal.  eICU Interventions  Plan: Tylenol 650 mg PO Q 6 hours PRN mild or moderate pain.     Intervention Category Major Interventions: Other:  Lenell Antu 06/09/2021, 11:08 PM

## 2021-06-09 NOTE — Interval H&P Note (Signed)
History and Physical Interval Note:  06/09/2021 2:36 PM  Felicia Davidson  has presented today for surgery, with the diagnosis of chest pain.  The various methods of treatment have been discussed with the patient and family. After consideration of risks, benefits and other options for treatment, the patient has consented to  Procedure(s): TEMPORARY PACEMAKER (N/A) as a surgical intervention.  The patient's history has been reviewed, patient examined, no change in status, stable for surgery.  I have reviewed the patient's chart and labs.  Questions were answered to the patient's satisfaction.     Keniya Schlotterbeck J Adysson Revelle

## 2021-06-09 NOTE — Progress Notes (Signed)
Consult received for PIV placement.Upon [facility] arrived. Pt has been transferred of the unit. Tomasita Morrow, RN [facility]

## 2021-06-10 ENCOUNTER — Inpatient Hospital Stay (HOSPITAL_COMMUNITY): Payer: Medicare Other

## 2021-06-10 ENCOUNTER — Encounter (HOSPITAL_COMMUNITY): Payer: Self-pay | Admitting: Cardiology

## 2021-06-10 ENCOUNTER — Encounter (HOSPITAL_COMMUNITY)
Admission: EM | Disposition: A | Discharge status: Home / Self Care | Payer: Self-pay | Source: Home / Self Care | Attending: Pulmonary Disease

## 2021-06-10 DIAGNOSIS — N170 Acute kidney failure with tubular necrosis: Secondary | ICD-10-CM

## 2021-06-10 DIAGNOSIS — I152 Hypertension secondary to endocrine disorders: Secondary | ICD-10-CM

## 2021-06-10 DIAGNOSIS — I442 Atrioventricular block, complete: Secondary | ICD-10-CM

## 2021-06-10 DIAGNOSIS — E1159 Type 2 diabetes mellitus with other circulatory complications: Secondary | ICD-10-CM | POA: Diagnosis not present

## 2021-06-10 DIAGNOSIS — J810 Acute pulmonary edema: Secondary | ICD-10-CM | POA: Diagnosis not present

## 2021-06-10 HISTORY — PX: TEMPORARY PACEMAKER: CATH118268

## 2021-06-10 LAB — BASIC METABOLIC PANEL
Anion gap: 11 (ref 5–15)
BUN: 63 mg/dL — ABNORMAL HIGH (ref 8–23)
CO2: 19 mmol/L — ABNORMAL LOW (ref 22–32)
Calcium: 8.6 mg/dL — ABNORMAL LOW (ref 8.9–10.3)
Chloride: 104 mmol/L (ref 98–111)
Creatinine, Ser: 2.51 mg/dL — ABNORMAL HIGH (ref 0.44–1.00)
GFR, Estimated: 19 mL/min — ABNORMAL LOW (ref >60)
Glucose, Bld: 108 mg/dL — ABNORMAL HIGH (ref 70–99)
Potassium: 4.6 mmol/L (ref 3.5–5.1)
Sodium: 134 mmol/L — ABNORMAL LOW (ref 135–145)

## 2021-06-10 LAB — URINE CULTURE: Culture: NO GROWTH

## 2021-06-10 LAB — GLUCOSE, CAPILLARY
Glucose-Capillary: 108 mg/dL — ABNORMAL HIGH (ref 70–99)
Glucose-Capillary: 109 mg/dL — ABNORMAL HIGH (ref 70–99)
Glucose-Capillary: 175 mg/dL — ABNORMAL HIGH (ref 70–99)
Glucose-Capillary: 72 mg/dL (ref 70–99)
Glucose-Capillary: 73 mg/dL (ref 70–99)
Glucose-Capillary: 94 mg/dL (ref 70–99)
Glucose-Capillary: 98 mg/dL (ref 70–99)

## 2021-06-10 LAB — CBC
HCT: 25.7 % — ABNORMAL LOW (ref 36.0–46.0)
Hemoglobin: 8.6 g/dL — ABNORMAL LOW (ref 12.0–15.0)
MCH: 30.2 pg (ref 26.0–34.0)
MCHC: 33.5 g/dL (ref 30.0–36.0)
MCV: 90.2 fL (ref 80.0–100.0)
Platelets: 214 10*3/uL (ref 150–400)
RBC: 2.85 MIL/uL — ABNORMAL LOW (ref 3.87–5.11)
RDW: 12.9 % (ref 11.5–15.5)
WBC: 10.9 10*3/uL — ABNORMAL HIGH (ref 4.0–10.5)
nRBC: 0 % (ref 0.0–0.2)

## 2021-06-10 LAB — MAGNESIUM: Magnesium: 1.8 mg/dL (ref 1.7–2.4)

## 2021-06-10 LAB — PROCALCITONIN: Procalcitonin: 0.75 ng/mL

## 2021-06-10 SURGERY — TEMPORARY PACEMAKER
Anesthesia: LOCAL

## 2021-06-10 MED ORDER — SODIUM CHLORIDE 0.9 % IV SOLN: INTRAVENOUS | Status: DC | PRN

## 2021-06-10 MED ORDER — LIDOCAINE HCL (PF) 1 % IJ SOLN
INTRAMUSCULAR | Status: AC
  Filled 2021-06-10: qty 30

## 2021-06-10 MED ORDER — LIDOCAINE HCL (PF) 1 % IJ SOLN
INTRAMUSCULAR | Status: DC | PRN
  Administered 2021-06-10: 5 mL via INTRADERMAL

## 2021-06-10 MED ORDER — HEPARIN (PORCINE) IN NACL 1000-0.9 UT/500ML-% IV SOLN
INTRAVENOUS | Status: DC | PRN
  Administered 2021-06-10: 500 mL

## 2021-06-10 MED ORDER — MAGNESIUM SULFATE IN D5W 1-5 GM/100ML-% IV SOLN
1.0000 g | Freq: Once | INTRAVENOUS | Status: AC
  Administered 2021-06-10: 1 g via INTRAVENOUS
  Filled 2021-06-10: qty 100

## 2021-06-10 SURGICAL SUPPLY — 8 items
CABLE ADAPT PACING TEMP 12FT (ADAPTER) ×1 IMPLANT
KIT HEART LEFT (KITS) ×1 IMPLANT
PACK CARDIAC CATHETERIZATION (CUSTOM PROCEDURE TRAY) ×1 IMPLANT
PROTECTION STATION PRESSURIZED (MISCELLANEOUS) ×2
SHEATH PINNACLE 6F 10CM (SHEATH) ×1 IMPLANT
SLEEVE REPOSITIONING LENGTH 30 (MISCELLANEOUS) ×1 IMPLANT
STATION PROTECTION PRESSURIZED (MISCELLANEOUS) IMPLANT
WIRE PACING TEMP ST TIP 5 (CATHETERS) ×1 IMPLANT

## 2021-06-10 NOTE — Consult Note (Signed)
ELECTROPHYSIOLOGY CONSULT NOTE    Patient ID: Felicia Davidson MRN: [id number], DOB/AGE: 01-23-1945 40 y.o.  Admit date: 06/09/2021 Date of Consult: 06/10/2021  Primary Physician: Kaleen Mask, MD Primary Cardiologist: None  Electrophysiologist:  New  Referring Provider: Dr. Rosemary Holms  Patient Profile: Felicia Davidson is a 76 y.o. female with a history of DM2, Neuropathy, Anemia, HTN, and HLD who is being seen today for the evaluation of CHB at the request of Dr. Rosemary Holms.  HPI:  Felicia Davidson is a 76 y.o. female with medical history as above.   Pt presented to PCP 5/31 with several days of fatigue and DOE and was found to have peripheral edema and CHB, sent urgently to ED.   Pt remained in CHB on telemetry at ED. Labs on admission showed hyperkalemia at 6.0 and ARF at cr of 2.6 from baseline of 0.9 - 1.1. Received lokelma and calcium gluconate. Taken urgently for temp perm pacemaker with HR in 30s.   Today, pt is comfortable at rest sitting upright in bed. She reports having COVID in January, with non specific fatigue since then, but worse over past 3-4 days.  She had a "syncopal" episode in January in the setting of hypoglycemia, otherwise denies syncope.   Echo 06/09/2021 shows EF 60-65%  Labs today K 4.6, Cr 2.51. Mg 1.8. Magnesium supplemented.   Past Medical History:  Diagnosis Date   Anemia    Neuropathy      Surgical History:  Past Surgical History:  Procedure Laterality Date   COMBINED HYSTEROSCOPY DIAGNOSTIC / D&C     TEMPORARY PACEMAKER N/A 06/09/2021   Procedure: TEMPORARY PACEMAKER;  Surgeon: Elder Negus, MD;  Location: [facility];  Service: Cardiovascular;  Laterality: N/A;     Medications Prior to Admission  Medication Sig Dispense Refill Last Dose   amLODipine (NORVASC) 10 MG tablet Take 10 mg by mouth daily.      Calcium Carbonate (CALCIUM 600 PO) Take 1 tablet by mouth 2 (two) times daily. With Minerals 2 times daily       carvedilol (COREG) 25 MG tablet Take 0.5 tablets (12.5 mg total) by mouth 2 (two) times daily.      ferrous sulfate 325 (65 FE) MG tablet Take 325 mg by mouth daily with breakfast.       glipiZIDE (GLUCOTROL) 10 MG tablet Take 5 mg by mouth 2 (two) times daily before a meal.  1    metFORMIN (GLUCOPHAGE) 1000 MG tablet Take 1,000 mg by mouth 2 (two) times daily with a meal.      olmesartan (BENICAR) 40 MG tablet Take 40 mg by mouth every evening.      rosuvastatin (CRESTOR) 10 MG tablet Take 10 mg by mouth daily.   1    vitamin B-12 (CYANOCOBALAMIN) 500 MCG tablet Take 500 mcg by mouth daily.      furosemide (LASIX) 20 MG tablet Take 1 tablet (20 mg total) by mouth daily. 30 tablet 0     Inpatient Medications:   Chlorhexidine Gluconate Cloth  6 each Topical Daily   heparin  5,000 Units Subcutaneous Q8H   insulin aspart  0-9 Units Subcutaneous Q4H   mouth rinse  15 mL Mouth Rinse BID   rosuvastatin  10 mg Oral Daily    Allergies:  Allergies  Allergen Reactions   Lipitor [Atorvastatin] Other (See Comments)    Sore muscles   Penicillins Other (See Comments)    Stiff joints    Social History  Socioeconomic History   Marital status: Married    Spouse name: Not on file   Number of children: Not on file   Years of education: Not on file   Highest education level: Not on file  Occupational History   Not on file  Tobacco Use   Smoking status: Former   Smokeless tobacco: Never  Vaping Use   Vaping Use: Never used  Substance and Sexual Activity   Alcohol use: No    Alcohol/week: 0.0 standard drinks   Drug use: No   Sexual activity: Not on file  Other Topics Concern   Not on file  Social History Narrative   Not on file   Social Determinants of Health   Financial Resource Strain: Not on file  Food Insecurity: Not on file  Transportation Needs: Not on file  Physical Activity: Not on file  Stress: Not on file  Social Connections: Not on file  Intimate Partner Violence:  Not on file     Family History  Problem Relation Age of Onset   Anemia Mother    CAD Father    Lung disease Maternal Grandfather      Review of Systems: All other systems reviewed and are otherwise negative except as noted above.  Physical Exam: Vitals:   06/10/21 0430 06/10/21 0500 06/10/21 0600 06/10/21 0700  BP: (!) 127/41 (!) 127/47 (!) 122/41   Pulse: 61 69 67 70  Resp: (!) 21 20 18 20   Temp:    98.5 F (36.9 C)  TempSrc:    Oral  SpO2: 95% 95% 93% 96%  Weight:      Height:        GEN- The patient is well appearing, alert and oriented x 3 today.   HEENT: normocephalic, atraumatic; sclera clear, conjunctiva pink; hearing intact; oropharynx clear; neck supple Lungs- Clear to ausculation bilaterally, normal work of breathing.  No wheezes, rales, rhonchi Heart- Regular rate and rhythm, no murmurs, rubs or gallops GI- soft, non-tender, non-distended, bowel sounds present Extremities- no clubbing, cyanosis, or edema; DP/PT/radial pulses 2+ bilaterally MS- no significant deformity or atrophy Skin- warm and dry, no rash or lesion Psych- euthymic mood, full affect Neuro- strength and sensation are intact  Labs:   Lab Results  Component Value Date   WBC 10.9 (H) 06/10/2021   HGB 8.6 (L) 06/10/2021   HCT 25.7 (L) 06/10/2021   MCV 90.2 06/10/2021   PLT 214 06/10/2021    Recent Labs  Lab 06/09/21 1004 06/09/21 1228 06/10/21 0103  NA 130*   < > 134*  K 6.0*   < > 4.6  CL 103   < > 104  CO2 17*   < > 19*  BUN 67*   < > 63*  CREATININE 2.65*   < > 2.51*  CALCIUM 8.6*   < > 8.6*  PROT 6.4*  --   --   BILITOT 0.8  --   --   ALKPHOS 89  --   --   ALT 11  --   --   AST 14*  --   --   GLUCOSE 128*   < > 108*   < > = values in this interval not displayed.   Radiology/Studies: CARDIAC CATHETERIZATION  Result Date: 06/09/2021 Images from the original result were not included. Right IJ venous access Successful temporary pacemaker placement 06/11/2021, MD  Pager: [phone] Office: [phone]  DG CHEST PORT 1 VIEW  Result Date: 06/10/2021 CLINICAL DATA:  Temporary pacemaker  EXAM: PORTABLE CHEST 1 VIEW COMPARISON:  06/09/2021 FINDINGS: Right IJ transvenous pacer again identified. Lead again overlies the right ventricle. Decreased pulmonary edema. No pleural effusion or pneumothorax. Stable cardiomediastinal contours. IMPRESSION: Decreased pulmonary edema. Electronically Signed   By: Guadlupe Spanish M.D.   On: 06/10/2021 08:05   DG Chest Port 1 View  Result Date: 06/09/2021 CLINICAL DATA:  Temporary transvenous pacer placement. EXAM: PORTABLE CHEST 1 VIEW COMPARISON:  Jun 09, 2021. FINDINGS: EKG leads project over the chest. Interval placement of RIGHT IJ transvenous pacer device, single lead projects over the cardiac silhouette in the area of RIGHT ventricle or even towards RIGHT ventricular outflow tract, this is not clear. Heart size is stable. No lobar consolidation. Fullness of the RIGHT and LEFT hilum with vascular congestion and mild increased interstitial markings. No pneumothorax. Subtle basilar airspace disease bilaterally. On limited assessment there is no acute skeletal finding. IMPRESSION: 1. Interval placement of RIGHT IJ transvenous pacer device with single lead projecting over the cardiac silhouette in the area of RIGHT ventricle or even towards RIGHT ventricular outflow tract, this is not clear. Correlate with function and with lateral view as warranted for further assessment. 2. Vascular congestion with mild interstitial edema and bibasilar airspace disease, likely atelectasis. Electronically Signed   By: Donzetta Kohut M.D.   On: 06/09/2021 16:08   DG Chest Port 1 View  Result Date: 06/09/2021 CLINICAL DATA:  Shortness of breath and weakness.  Bradycardia. EXAM: PORTABLE CHEST 1 VIEW COMPARISON:  11/19/2020. FINDINGS: Trachea is midline. Heart size stable. Defibrillator pads project over the left mid chest and epigastric region. Mild  interstitial prominence and indistinctness bilaterally. No dense airspace consolidation or definite pleural fluid. IMPRESSION: Mild interstitial prominence and indistinctness, suspicious for pulmonary edema. Electronically Signed   By: Leanna Battles M.D.   On: 06/09/2021 10:15   ECHOCARDIOGRAM COMPLETE  Result Date: 06/09/2021    ECHOCARDIOGRAM REPORT   Patient Name:   [facility] Date of Exam: 06/09/2021 Medical Rec #:  [id number]      Height:       63.0 in Accession #:    [id number]     Weight:       199.0 lb Date of Birth:  [birth year]     BSA:          1.930 m Patient Age:    75 years       BP:           139/50 mmHg Patient Gender: F              HR:           35 bpm. Exam Location:  Inpatient Procedure: 2D Echo, Cardiac Doppler and Color Doppler Indications:     Heart block, Complete I44.2  History:         Patient has prior history of Echocardiogram examinations, most                  recent 11/19/2020. Risk Factors:Diabetes.  Sonographer:     Leta Jungling RDCS Referring Phys:  [id number] Lynnell Catalan Diagnosing Phys: Truett Mainland MD IMPRESSIONS  1. Left ventricular ejection fraction, by estimation, is 60 to 65%. The left ventricle has normal function. The left ventricle has no regional wall motion abnormalities. There is mild left ventricular hypertrophy. Left ventricular diastolic function not  assessed due to complete AV blocl.  2. Right ventricular systolic function is normal. The right ventricular size is normal.  3. The mitral valve is  normal in structure. Mild mitral valve regurgitation. No evidence of mitral stenosis.  4. The aortic valve is normal in structure. Aortic valve regurgitation is mild. No aortic stenosis is present. FINDINGS  Left Ventricle: Left ventricular ejection fraction, by estimation, is 60 to 65%. The left ventricle has normal function. The left ventricle has no regional wall motion abnormalities. The left ventricular internal cavity size was normal in size. There is   mild left ventricular hypertrophy. Left ventricular diastolic parameters are indeterminate. Right Ventricle: The right ventricular size is normal. No increase in right ventricular wall thickness. Right ventricular systolic function is normal. Left Atrium: Left atrial size was normal in size. Right Atrium: Right atrial size was normal in size. Pericardium: There is no evidence of pericardial effusion. Mitral Valve: The mitral valve is normal in structure. Mild mitral valve regurgitation. No evidence of mitral valve stenosis. Tricuspid Valve: The tricuspid valve is normal in structure. Tricuspid valve regurgitation is mild . No evidence of tricuspid stenosis. Aortic Valve: The aortic valve is normal in structure. Aortic valve regurgitation is mild. Aortic regurgitation PHT measures 876 msec. No aortic stenosis is present. Aortic valve mean gradient measures 18.0 mmHg. Aortic valve peak gradient measures 27.2 mmHg. Aortic valve area, by VTI measures 1.70 cm. Pulmonic Valve: The pulmonic valve was normal in structure. Pulmonic valve regurgitation is trivial. Aorta: The aortic root is normal in size and structure. IAS/Shunts: The interatrial septum was not assessed.  LEFT VENTRICLE PLAX 2D LVIDd:         4.80 cm   Diastology LVIDs:         3.00 cm   LV e' medial:    8.17 cm/s LV PW:         1.20 cm   LV E/e' medial:  15.7 LV IVS:        1.20 cm   LV e' lateral:   14.60 cm/s LVOT diam:     1.90 cm   LV E/e' lateral: 8.8 LV SV:         120 LV SV Index:   62 LVOT Area:     2.84 cm  RIGHT VENTRICLE RV S prime:     16.20 cm/s TAPSE (M-mode): 2.2 cm LEFT ATRIUM             Index        RIGHT ATRIUM           Index LA diam:        4.20 cm 2.18 cm/m   RA Area:     15.80 cm LA Vol (A2C):   39.7 ml 20.57 ml/m  RA Volume:   36.30 ml  18.81 ml/m LA Vol (A4C):   41.5 ml 21.51 ml/m LA Biplane Vol: 41.4 ml 21.46 ml/m  AORTIC VALVE AV Area (Vmax):    1.79 cm AV Area (Vmean):   1.62 cm AV Area (VTI):     1.70 cm AV Vmax:            261.00 cm/s AV Vmean:          201.000 cm/s AV VTI:            0.704 m AV Peak Grad:      27.2 mmHg AV Mean Grad:      18.0 mmHg LVOT Vmax:         165.00 cm/s LVOT Vmean:        115.000 cm/s LVOT VTI:          0.423  m LVOT/AV VTI ratio: 0.60 AI PHT:            876 msec  AORTA Ao Root diam: 3.00 cm Ao Asc diam:  3.50 cm MITRAL VALVE MV Area (PHT): 3.77 cm     SHUNTS MV Decel Time: 201 msec     Systemic VTI:  0.42 m MV E velocity: 128.50 cm/s  Systemic Diam: 1.90 cm MV A velocity: 107.50 cm/s MV E/A ratio:  1.20 Truett Mainland MD Electronically signed by Truett Mainland MD Signature Date/Time: 06/09/2021/2:41:21 PM    Final     EKG:on arrival 06/09/2021 shows CHB in 30s (personally reviewed)  Baseline EKG 11/2020 shows NSR with LBBB  TELEMETRY: V paced in 60s with interrmitent ? Loss of capture (personally reviewed)  Assessment/Plan: 1.  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 Without reversible cause. Holding BB without resolution On dobutamine  Echo 06/09/2021 EF 55-60% Explained risks, benefits, and alternatives to PPM implantation, including but not limited to bleeding, infection, pneumothorax, pericardial effusion, lead dislodgement, heart attack, stroke, or death.  Pt verbalized understanding and agrees to proceed if indicated.   2. DM2 A1c 5.9  3. AKI K 2.51 today baseline appears 0.9 - 1.1  Tentatively plan for PPM today vs tomorrow. Dr. Lalla Brothers to see. Will make NPO for now. She had a clear breakfast.   For questions or updates, please contact [facility] Please consult [email or web address] for contact info under Cardiology/STEMI.  Dustin Flock, PA-C  06/10/2021 9:07 AM

## 2021-06-10 NOTE — H&P (View-Only) (Signed)
Discussed w/Dr. Lalla Brothers. He recommended waiting one more before PPM placement to let kidneys and anemia recover.  Temp pacer in place right now has intermittent loss of capture. Since we are waiting one more night, we decided it would be better to exchange to a straight tip catheter for better capture.  Consent obtained from the patient.   Elder Negus, MD Pager: [phone] Office: [phone]

## 2021-06-10 NOTE — Care Management (Signed)
  Transition of Care [facility]) Screening Note   Patient Details  Name: Ski Norred Date of Birth: 1945/09/02   [facility] of [facility]) CM/SW Contact:    Bethena Roys, RN Phone Number: 06/10/2021, 4:50 PM    Transition of Care Department [facility]) has reviewed the patient and no TOC needs have been identified at this time. Patient was sent urgently to the ED due to fatigue, dyspnea, and CHB. Case Manager will continue to follow the patient for disposition needs as she progresses.

## 2021-06-10 NOTE — Progress Notes (Signed)
Discussed w/Dr. Lambert. He recommended waiting one more before PPM placement to let kidneys and anemia recover.  Temp pacer in place right now has intermittent loss of capture. Since we are waiting one more night, we decided it would be better to exchange to a straight tip catheter for better capture.  Consent obtained from the patient.   Felicia Davidson J Donta Fuster, MD Pager: [phone] Office: [phone]  

## 2021-06-10 NOTE — Progress Notes (Signed)
Subjective:  Feels well  Felt her heart beating hard last night, resolved now  Objective:  Vital Signs in the last 24 hours: Temp:  [97.9 F (36.6 C)-98.7 F (37.1 C)] 98.5 F (36.9 C) (06/01 0700) Pulse Rate:  [33-84] 66 (06/01 0930) Resp:  [11-29] 20 (06/01 0930) BP: (106-167)/(37-84) 123/42 (06/01 0900) SpO2:  [88 %-98 %] 93 % (06/01 0930) Weight:  [90.3 kg] 90.3 kg (05/31 1003)  Intake/Output from previous day: 05/31 0701 - 06/01 0700 In: 1274.7 [P.O.:840; I.V.:290.3; IV Piggyback:144.4] Out: 1450 [Urine:1450]  Physical Exam Vitals and nursing note reviewed.  Constitutional:      General: She is not in acute distress. Neck:     Vascular: No JVD.  Cardiovascular:     Rate and Rhythm: Normal rate and regular rhythm.     Heart sounds: Normal heart sounds. No murmur heard. Pulmonary:     Effort: Pulmonary effort is normal.     Breath sounds: Normal breath sounds. No wheezing or rales.  Musculoskeletal:     Right lower leg: Edema (2+) present.     Left lower leg: Edema (2+) present.    Cardiac Studies:  Telemetry 06/10/2021: Intermittent loss of capture  EKG 06/10/2021: Sinus rhythm Complete AV block Junctional escape rhythm with RBBB, LAFB  EKG 06/10/2021: Ventricular-paced rhythm with junctional escape complexes  Echocardiogram 06/09/2021:  1. Left ventricular ejection fraction, by estimation, is 60 to 65%. The  left ventricle has normal function. The left ventricle has no regional  wall motion abnormalities. There is mild left ventricular hypertrophy.  Left ventricular diastolic function not   assessed due to complete AV blocl.   2. Right ventricular systolic function is normal. The right ventricular  size is normal.   3. The mitral valve is normal in structure. Mild mitral valve  regurgitation. No evidence of mitral stenosis.   4. The aortic valve is normal in structure. Aortic valve regurgitation is  mild. No aortic stenosis is present.   Imaging/tests  reviewed and independently interpreted: CXR 06/10/2021: Right IJ transvenous pacer again identified. Lead again overlies the right ventricle. Decreased pulmonary edema. No pleural effusion or pneumothorax. Stable cardiomediastinal contours.   IMPRESSION: Decreased pulmonary edema.    Assessment & Recommendations:  76 y.o. 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 I reckon she will need permanent pacemaker. Appreciate EP input. Continue temporary pacemaker for now. There is some respiratory movement of the catheter with intermittent loss of capture for no more than 1-2 beats. I will leave this in place for now. She is also on dobutamine, which can be weaned down to 5 mics. We should be able to wean her off this today.  Shortness of breath: Acute HFpEF driven by complete AV block BNP 549, trop normal She will need a lot more diuresis. Consider 40 mg IV bid lasix.   AKI: Due to complete AV block I expect his to improve  CRITICAL CARE Performed by: Truett Mainland   Total critical care time: 35 minutes   Critical care time was exclusive of separately billable procedures and treating other patients.   Critical care was necessary to treat or prevent imminent or life-threatening deterioration.   Critical care was time spent personally by me on the following activities:  development of treatment plan with patient and/or surrogate as well as nursing, discussions with consultants, evaluation of patient's response to treatment, examination of patient, obtaining history from patient or surrogate, ordering and performing treatments and interventions, ordering and review of laboratory  studies, ordering and review of radiographic studies, pulse oximetry and re-evaluation of patient's condition.     Discussed interpretation of tests and management recommendations with the primary team   Elder Negus, MD Pager: [phone] Office: [phone]

## 2021-06-10 NOTE — Progress Notes (Signed)
NAMETeyla Davidson, MRN:  [id number], DOB:  06-10-1945, LOS: 1 ADMISSION DATE:  06/09/2021, CONSULTATION DATE:  5/31 REFERRING MD:  Dr. Hyacinth Meeker, CHIEF COMPLAINT:  complete heart block   History of Present Illness:  76 year old female with prior history of HTN, HLD, DMT2, and anemia presenting from home with 3-4 day history of progressive exertional shortness of breath, weakness, and lower leg swelling.  She was seen by her PCP yesterday for same, noted HR in 40-50's and they were unable to obtain blood work.  She was supposed to return today for repeat attempt at blood work but given her progressive symptoms, patient called EMS.   Denies any recent chest pain, fevers, or cough/ sputum production.   Was found to be in complete heart block with EMS.  She takes norvasc, coreg, lasix, and olmesartan at home.  In ER, continues to be in complete heart block with ventricular rate in the 30's, however currently maintaining blood pressure, 167/46 with saturations 93% on room air, afebrile and noted to have 3+ pitting lower extremity edema.   Workup significant for Na 130, K 6, bicarb 17, BUN 67, sCr 2.65, BNP 549, trop hs 16, WBC 13.8, Hgb 9.2, CXR consistent with mild pulmonary edema.  She was treated with lasix, bicarb, calcium gluconate, and lokelmia in ER.  Dr. Rosemary Holms consulted with cardiology with plans for temporary pacemaker.  PCCM called for ICU admit.    Pertinent  Medical History   Former smoker, HTN, DMT2, neuropathy, anemia, HLD, COVID 01/2021  Significant Hospital Events: Including procedures, antibiotic start and stop dates in addition to other pertinent events   5/31 admit with CHB, AKI, acute HF, hyperkalemia> temp pacer; started on dobutamine 6/1: temp pacemaker in place but occasionally does not capture with HR as low as 40s; Remains on dobutamine  Interim History / Subjective:  Patient AO in NAD HR 60s with pacing Remains on dobutamine  Objective   Blood pressure (!) 122/41,  pulse 70, temperature 98.5 F (36.9 C), temperature source Oral, resp. rate 20, height 5\' 3"  (1.6 m), weight 90.3 kg, SpO2 96 %.        Intake/Output Summary (Last 24 hours) at 06/10/2021 0717 Last data filed at 06/10/2021 0700 Gross per 24 hour  Intake 1274.72 ml  Output 1450 ml  Net -175.28 ml   Filed Weights   06/09/21 1003  Weight: 90.3 kg    Examination: General:  Elderly appearing female in NAD HEENT: MM pink/moist; [place] in place; JVD present Neuro: Aox3; MAE CV: s1s2, paced rhythm in 60s, no m/r/g PULM:  dim clear BS bilaterally GI: soft, bsx4 active  Extremities: warm/dry, 2+ BLE edema  Skin: no rashes or lesions appreciated   Resolved Hospital Problem list     Assessment & Plan:  Complete heart block Elevated BNP P: -cardiology consulted; appreciate recs -admit to icu w/ continuous telemetry monitoring -continue temp pacemaker;  -consider temp Pacemaker per cards; plan to give time for home medications to wear off; will likely need permanent pacemaker; will likely need EP consult -continue dobutamine -trend electrolytes and replete as needed -avoid BB  Pulmonary Edema: 5/31: echo LVEF 60-65% P: -wean [place] for sats >92% -CXR improving pulm edema; trend CXR and IV lasix as needed -pulm toiletry: IS  AKI: baseline creat 1.06-1.17 6 months ago Hyperkalemia: improved Hypomagnesemia P: -repleted mag this am -Trend BMP/mag/ urinary output -Replace electrolytes as indicated -Avoid nephrotoxic agents, ensure adequate renal perfusion  Hyponatremia: improving; likely hypervolemia P: -  trend bmp  Leukocytosis: reactive? P: -WBC trending down; afebrile -hold on abx for now -trend PCT -UA negative; f/u UC  Hx of HTN: on carvedilol, olmesartan, and norvasc at home Hx of HLD -Hold home BB; hold home ARB in setting of hyperkalemia and AKI -currently normotensive; will hold home bp meds for now -continue statin  DMT2 P: -ssi and cbg monitoring  Chronic  anemia P: -trend cbc  Best Practice (right click and "Reselect all SmartList Selections" daily)   Diet/type: full liquids   DVT prophylaxis: prophylactic heparin  GI prophylaxis: N/A Lines: N/A Foley:  N/A Code Status:  full code Last date of multidisciplinary goals of care discussion [6/1 spoke with patient at bedside and updated]  Critical care time: 35 minutes    JD Anselm Lis [facility] 06/10/2021, 7:17 AM  Please see [email or web address] for pager details.  From 7A-7P if no response, please call [phone]. After hours, please call ELink [phone].

## 2021-06-10 NOTE — Interval H&P Note (Signed)
History and Physical Interval Note:  06/10/2021 10:40 AM  Felicia Davidson  has presented today for surgery, with the diagnosis of chest pain.  The various methods of treatment have been discussed with the patient and family. After consideration of risks, benefits and other options for treatment, the patient has consented to  Procedure(s): TEMPORARY PACEMAKER (N/A) as a surgical intervention.  The patient's history has been reviewed, patient examined, no change in status, stable for surgery.  I have reviewed the patient's chart and labs.  Questions were answered to the patient's satisfaction.    Exchanging the current temporary  pacemaker to a straight tip catheter for better capture.  Felicia Davidson

## 2021-06-10 NOTE — Progress Notes (Signed)
eLink Physician-Brief Progress Note Patient Name: Felicia Davidson DOB: 10/21/1945 MRN: [id number]   Date of Service  06/10/2021  HPI/Events of Note  Hypomagnesemia - Mg++ = 1.8 and Creatinine = 2.51.  eICU Interventions  Will replace Mg++.     Intervention Category Major Interventions: Electrolyte abnormality - evaluation and management  Felicia Davidson 06/10/2021, 3:16 AM

## 2021-06-11 ENCOUNTER — Inpatient Hospital Stay (HOSPITAL_COMMUNITY): Payer: Medicare Other

## 2021-06-11 ENCOUNTER — Encounter (HOSPITAL_COMMUNITY): Payer: Self-pay | Admitting: Pulmonary Disease

## 2021-06-11 ENCOUNTER — Encounter (HOSPITAL_COMMUNITY)
Admission: EM | Disposition: A | Discharge status: Home / Self Care | Payer: Self-pay | Source: Home / Self Care | Attending: Pulmonary Disease

## 2021-06-11 DIAGNOSIS — I442 Atrioventricular block, complete: Secondary | ICD-10-CM | POA: Diagnosis not present

## 2021-06-11 HISTORY — PX: PACEMAKER IMPLANT: EP1218

## 2021-06-11 LAB — BASIC METABOLIC PANEL
Anion gap: 8 (ref 5–15)
BUN: 54 mg/dL — ABNORMAL HIGH (ref 8–23)
CO2: 22 mmol/L (ref 22–32)
Calcium: 8.2 mg/dL — ABNORMAL LOW (ref 8.9–10.3)
Chloride: 106 mmol/L (ref 98–111)
Creatinine, Ser: 2.11 mg/dL — ABNORMAL HIGH (ref 0.44–1.00)
GFR, Estimated: 24 mL/min — ABNORMAL LOW (ref >60)
Glucose, Bld: 97 mg/dL (ref 70–99)
Potassium: 4.5 mmol/L (ref 3.5–5.1)
Sodium: 136 mmol/L (ref 135–145)

## 2021-06-11 LAB — CBC
HCT: 26.9 % — ABNORMAL LOW (ref 36.0–46.0)
Hemoglobin: 8.7 g/dL — ABNORMAL LOW (ref 12.0–15.0)
MCH: 29.8 pg (ref 26.0–34.0)
MCHC: 32.3 g/dL (ref 30.0–36.0)
MCV: 92.1 fL (ref 80.0–100.0)
Platelets: 215 10*3/uL (ref 150–400)
RBC: 2.92 MIL/uL — ABNORMAL LOW (ref 3.87–5.11)
RDW: 13.2 % (ref 11.5–15.5)
WBC: 9.4 10*3/uL (ref 4.0–10.5)
nRBC: 0 % (ref 0.0–0.2)

## 2021-06-11 LAB — GLUCOSE, CAPILLARY
Glucose-Capillary: 104 mg/dL — ABNORMAL HIGH (ref 70–99)
Glucose-Capillary: 139 mg/dL — ABNORMAL HIGH (ref 70–99)
Glucose-Capillary: 150 mg/dL — ABNORMAL HIGH (ref 70–99)
Glucose-Capillary: 154 mg/dL — ABNORMAL HIGH (ref 70–99)
Glucose-Capillary: 280 mg/dL — ABNORMAL HIGH (ref 70–99)
Glucose-Capillary: 93 mg/dL (ref 70–99)

## 2021-06-11 LAB — SURGICAL PCR SCREEN
MRSA, PCR: NEGATIVE
Staphylococcus aureus: NEGATIVE

## 2021-06-11 LAB — MAGNESIUM: Magnesium: 2.2 mg/dL (ref 1.7–2.4)

## 2021-06-11 SURGERY — PACEMAKER IMPLANT

## 2021-06-11 MED ORDER — FENTANYL CITRATE (PF) 100 MCG/2ML IJ SOLN
INTRAMUSCULAR | Status: AC
  Filled 2021-06-11: qty 2

## 2021-06-11 MED ORDER — SODIUM CHLORIDE 0.9 % IV SOLN
80.0000 mg | INTRAVENOUS | Status: AC
  Administered 2021-06-11: 80 mg
  Filled 2021-06-11: qty 2

## 2021-06-11 MED ORDER — HEPARIN (PORCINE) IN NACL 1000-0.9 UT/500ML-% IV SOLN
INTRAVENOUS | Status: AC
  Filled 2021-06-11: qty 500

## 2021-06-11 MED ORDER — MIDAZOLAM HCL 5 MG/5ML IJ SOLN
INTRAMUSCULAR | Status: AC
  Filled 2021-06-11: qty 5

## 2021-06-11 MED ORDER — SODIUM CHLORIDE 0.9 % IV SOLN
INTRAVENOUS | Status: AC
  Filled 2021-06-11: qty 2

## 2021-06-11 MED ORDER — FENTANYL CITRATE (PF) 100 MCG/2ML IJ SOLN
INTRAMUSCULAR | Status: DC | PRN
Start: 2021-06-11 — End: 2021-06-11
  Administered 2021-06-11: 25 ug via INTRAVENOUS

## 2021-06-11 MED ORDER — MIDAZOLAM HCL 5 MG/5ML IJ SOLN
INTRAMUSCULAR | Status: DC | PRN
  Administered 2021-06-11: 1 mg via INTRAVENOUS

## 2021-06-11 MED ORDER — HEPARIN (PORCINE) IN NACL 1000-0.9 UT/500ML-% IV SOLN
INTRAVENOUS | Status: DC | PRN
  Administered 2021-06-11: 500 mL

## 2021-06-11 MED ORDER — LIDOCAINE HCL 1 % IJ SOLN
INTRAMUSCULAR | Status: AC
  Filled 2021-06-11: qty 60

## 2021-06-11 MED ORDER — VANCOMYCIN HCL IN DEXTROSE 1-5 GM/200ML-% IV SOLN
1000.0000 mg | INTRAVENOUS | Status: AC
  Administered 2021-06-11: 1000 mg via INTRAVENOUS

## 2021-06-11 MED ORDER — SODIUM CHLORIDE 0.9 % IV SOLN: INTRAVENOUS | Status: DC

## 2021-06-11 MED ORDER — VANCOMYCIN HCL IN DEXTROSE 1-5 GM/200ML-% IV SOLN
INTRAVENOUS | Status: AC
  Filled 2021-06-11: qty 200

## 2021-06-11 MED ORDER — LIDOCAINE HCL (PF) 1 % IJ SOLN
INTRAMUSCULAR | Status: DC | PRN
  Administered 2021-06-11: 60 mL

## 2021-06-11 MED ORDER — CHLORHEXIDINE GLUCONATE 4 % EX LIQD
60.0000 mL | Freq: Once | CUTANEOUS | Status: AC
  Administered 2021-06-11: 4 via TOPICAL
  Filled 2021-06-11: qty 60

## 2021-06-11 MED ORDER — VANCOMYCIN HCL IN DEXTROSE 1-5 GM/200ML-% IV SOLN
1000.0000 mg | Freq: Two times a day (BID) | INTRAVENOUS | Status: AC
  Administered 2021-06-12: 1000 mg via INTRAVENOUS
  Filled 2021-06-11: qty 200

## 2021-06-11 MED ORDER — ONDANSETRON HCL 4 MG/2ML IJ SOLN: 4.0000 mg | Freq: Four times a day (QID) | INTRAMUSCULAR | Status: DC | PRN

## 2021-06-11 MED ORDER — CHLORHEXIDINE GLUCONATE 4 % EX LIQD: 60.0000 mL | Freq: Once | CUTANEOUS | Status: DC

## 2021-06-11 SURGICAL SUPPLY — 15 items
CABLE SURGICAL S-101-97-12 (CABLE) ×2 IMPLANT
CATH CPS LOCATOR 3D MED (CATHETERS) ×1 IMPLANT
CPS IMPLANT KIT 410190 (MISCELLANEOUS) ×1 IMPLANT
HELIX LOCKING TOOL (MISCELLANEOUS) ×2
LEAD TENDRIL MRI 52CM LPA1200M (Lead) ×1 IMPLANT
LEAD TENDRIL SDX 2088TC-58CM (Lead) ×1 IMPLANT
MAT PREVALON FULL STRYKER (MISCELLANEOUS) ×1 IMPLANT
PACEMAKER ASSURITY DR-RF (Pacemaker) ×1 IMPLANT
PAD DEFIB RADIO PHYSIO CONN (PAD) ×2 IMPLANT
SHEATH 8FR PRELUDE SNAP 13 (SHEATH) ×1 IMPLANT
SHEATH 9FR PRELUDE SNAP 13 (SHEATH) ×1 IMPLANT
SHEATH PROBE COVER 6X72 (BAG) ×1 IMPLANT
SLITTER AGILIS HISPRO (INSTRUMENTS) ×1 IMPLANT
TOOL HELIX LOCKING (MISCELLANEOUS) IMPLANT
TRAY PACEMAKER INSERTION (PACKS) ×2 IMPLANT

## 2021-06-11 NOTE — Progress Notes (Signed)
Subjective:  Feels well   Objective:  Vital Signs in the last 24 hours: Temp:  [98.4 F (36.9 C)-98.7 F (37.1 C)] 98.7 F (37.1 C) (06/02 0733) Pulse Rate:  [0-111] 0 (06/02 0700) Resp:  [14-30] 16 (06/02 0600) BP: (88-144)/(37-77) 128/39 (06/02 0600) SpO2:  [91 %-99 %] 99 % (06/02 0600)  Intake/Output from previous day: 06/01 0701 - 06/02 0700 In: 237.2 [I.V.:237.2] Out: 1050 [Urine:1050]  Physical Exam Vitals and nursing note reviewed.  Constitutional:      General: She is not in acute distress. Neck:     Vascular: No JVD.  Cardiovascular:     Rate and Rhythm: Normal rate and regular rhythm.     Heart sounds: Normal heart sounds. No murmur heard. Pulmonary:     Effort: Pulmonary effort is normal.     Breath sounds: Normal breath sounds. No wheezing or rales.  Musculoskeletal:     Right lower leg: Edema (1+) present.     Left lower leg: Edema (1+) present.    Cardiac Studies:  Telemetry 06/10/2021: Intermittent loss of capture  EKG 06/10/2021: Sinus rhythm Complete AV block Junctional escape rhythm with RBBB, LAFB  EKG 06/10/2021: Ventricular-paced rhythm with junctional escape complexes  Echocardiogram 06/09/2021:  1. Left ventricular ejection fraction, by estimation, is 60 to 65%. The  left ventricle has normal function. The left ventricle has no regional  wall motion abnormalities. There is mild left ventricular hypertrophy.  Left ventricular diastolic function not   assessed due to complete AV blocl.   2. Right ventricular systolic function is normal. The right ventricular  size is normal.   3. The mitral valve is normal in structure. Mild mitral valve  regurgitation. No evidence of mitral stenosis.   4. The aortic valve is normal in structure. Aortic valve regurgitation is  mild. No aortic stenosis is present.   Imaging/tests reviewed and independently interpreted: CXR 06/10/2021: Right IJ transvenous pacer again identified. Lead again overlies  the right ventricle. Decreased pulmonary edema. No pleural effusion or pneumothorax. Stable cardiomediastinal contours.   IMPRESSION: Decreased pulmonary edema.    Assessment & Recommendations:  76 y.o. 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 Exchanged curved tip tamp pacer to straight tip temp pacer with improved capturing (6/1). Plan for PPM today I reckon dobutamine can be weaned off today.  Shortness of breath: Acute HFpEF driven by complete AV block BNP 549, trop normal Volume status improving with pacing alone.   AKI: Due to complete AV block Improving. Cr 2.1 today.   I am away this weekend. My partners Dr. Jacinto Halim and Odis Hollingshead are available. I reckon she may be in position to be discharged over the weekend post PPM placement. Will arrange outpatient follow up.    Elder Negus, MD Pager: [phone] Office: [phone]

## 2021-06-11 NOTE — Progress Notes (Addendum)
NAMEMacrina Davidson, MRN:  [id number], DOB:  1945/06/28, LOS: 2 ADMISSION DATE:  06/09/2021, CONSULTATION DATE:  5/31 REFERRING MD:  Dr. Hyacinth Meeker, CHIEF COMPLAINT:  complete heart block   History of Present Illness:  76 year old female with prior history of HTN, HLD, DMT2, and anemia presenting from home with 3-4 day history of progressive exertional shortness of breath, weakness, and lower leg swelling.    She was seen by her PCP 5/30 for same, noted HR in 40-50's and they were unable to obtain blood work. She was supposed to return for repeat attempt at blood work but given her progressive symptoms, patient called EMS. Was found to be in complete heart block with EMS.  She takes norvasc, coreg, lasix, and olmesartan at home.    In ER, continues to be in complete heart block with ventricular rate in the 30's, however maintaining blood pressure, 167/46 with saturations 93% on room air, afebrile and noted to have 3+ pitting lower extremity edema. Workup significant for Na 130, K 6, bicarb 17, BUN 67, sCr 2.65, BNP 549, trop hs 16, WBC 13.8, Hgb 9.2, CXR consistent with mild pulmonary edema.  She was treated with lasix, bicarb, calcium gluconate, and lokelmia in ER.  Dr. Rosemary Holms consulted with cardiology with plans for temporary pacemaker.  PCCM called for ICU admit.    Pertinent  Medical History  Former smoker, HTN, DMT2, neuropathy, anemia, HLD, COVID 01/2021  Significant Hospital Events: Including procedures, antibiotic start and stop dates in addition to other pertinent events   5/31 admit with CHB, AKI, acute HF, hyperkalemia> temp pacer; started on dobutamine 6/1 temp pacemaker in place but occasionally does not capture with HR as low as 40s; Remains on dobutamine. Alert/oriented, HR in 60's in ICU on dobutamine  Interim History / Subjective:  Afebrile  Cultures negative to date  2L [place]  I/O 1L UOP, - in last 24 hours  Pending PPM placement pm of 6/2 per Dr. Hollie Beach pacer  exchanged overnight due to intermittent loss of capture   Objective   Blood pressure (!) 128/39, pulse (!) 0, temperature 98.7 F (37.1 C), temperature source Oral, resp. rate 16, height 5\' 3"  (1.6 m), weight 90.3 kg, SpO2 99 %.        Intake/Output Summary (Last 24 hours) at 06/11/2021 0825 Last data filed at 06/11/2021 0600 Gross per 24 hour  Intake 216.92 ml  Output 900 ml  Net -683.08 ml   Filed Weights   06/09/21 1003  Weight: 90.3 kg    Examination: General: pleasant elderly adult female lying in bed in NAD HEENT: MM pink/moist, wearing glasses, anicteric, fair dentition  Neuro: AAOx4, speech clear, MAE  CV: s1s2 RRR, paced in 60's, no m/r/g, RIJ pacing wire noted, on dobutamine PULM: non-labored at rest, lungs bilaterally clear with good air entry  GI: soft, bsx4 active  Extremities: warm/dry, 1+ pedal edema  Skin: no rashes or lesions  Resolved Hospital Problem list     Assessment & Plan:   Complete Heart Block Elevated BNP -appreciate Cardiology / EP -ICU monitoring  -PPM placement pending pm 6/2 per Dr. 8/2  -continue dobutamine  -hold home beta blockers -monitor electrolytes, replace as indicated  -abx on call for procedure > renal dosing per pharmacy  Pulmonary Edema 5/31: echo LVEF 60-65%.  Edema resolved 6/2.  -follow intermittent CXR  -wean O2 for sats >90% -pulmonary hygiene  -monitor I/O's   AKI  baseline creat 1.06-1.17 6 months ago  Hyperkalemia  Hypomagnesemia Baseline sr cr 1-1.17  -Trend BMP / urinary output -Replace electrolytes as indicated -Avoid nephrotoxic agents, ensure adequate renal perfusion  Hyponatremia Improving; likely hypervolemia -follow BMP   Leukocytosis Suspect reactive, resolved 6/2. UA negative.   -follow CBC   Hx of HTN n carvedilol, olmesartan, and norvasc at home Hx of HLD Home regimen coreg, olmesartan, norvasc  -hold home beta blocker, query length of time needed for clearance given AKI  -hold home  meds -continue statin   DMT2 -SSI, sensitive scale   Chronic Anemia -follow CBC, Hgb stable   Best Practice (right click and "Reselect all SmartList Selections" daily)  Diet/type: full liquids  NPO for procedure 6/2 DVT prophylaxis: prophylactic heparin  GI prophylaxis: N/A Lines: N/A Foley:  N/A Code Status:  full code Last date of multidisciplinary goals of care discussion: 6/2 - patient updated on plan of care   Critical care time: 33 minutes    Canary Brim, MSN, APRN, NP-C, AGACNP-BC [facility] 06/11/2021, 8:25 AM   Please see [email or web address] for pager details.   From 7A-7P if no response, please call [phone] After hours, please call ELink [phone]

## 2021-06-11 NOTE — Progress Notes (Addendum)
Electrophysiology Rounding Note  Patient Name: Felicia Davidson Date of Encounter: 06/11/2021  Primary Cardiologist: None Electrophysiologist: New   Subjective   NAEO after temp wire repositioned yesterday afternoon.   Inpatient Medications    Scheduled Meds:  Chlorhexidine Gluconate Cloth  6 each Topical Daily   insulin aspart  0-9 Units Subcutaneous Q4H   mouth rinse  15 mL Mouth Rinse BID   rosuvastatin  10 mg Oral Daily   Continuous Infusions:  sodium chloride 10 mL/hr at 06/11/21 0600   sodium chloride 10 mL/hr at 06/10/21 1417   DOBUTamine 5 mcg/kg/min (06/11/21 0600)   PRN Meds: sodium chloride, sodium chloride, acetaminophen, docusate sodium, polyethylene glycol   Vital Signs    Vitals:   06/11/21 0645 06/11/21 0650 06/11/21 0655 06/11/21 0700  BP:      Pulse: (!) 0 (!) 0 (!) 0 (!) 0  Resp:      Temp:      TempSrc:      SpO2:      Weight:      Height:        Intake/Output Summary (Last 24 hours) at 06/11/2021 0723 Last data filed at 06/11/2021 0600 Gross per 24 hour  Intake 237.17 ml  Output 1050 ml  Net -812.83 ml   Filed Weights   06/09/21 1003  Weight: 90.3 kg    Physical Exam    GEN- The patient is well appearing, alert and oriented x 3 today.   Head- normocephalic, atraumatic Eyes-  Sclera clear, conjunctiva pink Ears- hearing intact Oropharynx- clear Neck- supple Lungs- Clear to ausculation bilaterally, normal work of breathing Heart- Regular rate and rhythm, no murmurs, rubs or gallops GI- soft, NT, ND, + BS Extremities- no clubbing or cyanosis. No edema Skin- no rash or lesion Psych- euthymic mood, full affect Neuro- strength and sensation are intact  Labs    CBC Recent Labs    06/09/21 1004 06/09/21 1228 06/10/21 0100 06/11/21 0038  WBC 13.8*   < > 10.9* 9.4  NEUTROABS 11.1*  --   --   --   HGB 9.2*   < > 8.6* 8.7*  HCT 29.3*   < > 25.7* 26.9*  MCV 95.1   < > 90.2 92.1  PLT 218   < > 214 215   < > = values in this  interval not displayed.   Basic Metabolic Panel Recent Labs    25/05/39 0103 06/11/21 0038  NA 134* 136  K 4.6 4.5  CL 104 106  CO2 19* 22  GLUCOSE 108* 97  BUN 63* 54*  CREATININE 2.51* 2.11*  CALCIUM 8.6* 8.2*  MG 1.8 2.2   Liver Function Tests Recent Labs    06/09/21 1004  AST 14*  ALT 11  ALKPHOS 89  BILITOT 0.8  PROT 6.4*  ALBUMIN 3.3*   No results for input(s): LIPASE, AMYLASE in the last 72 hours. Cardiac Enzymes No results for input(s): CKTOTAL, CKMB, CKMBINDEX, TROPONINI in the last 72 hours.   Telemetry    V paced at 60 , underlying CHB (personally reviewed)  Radiology    CARDIAC CATHETERIZATION  Result Date: 06/10/2021 Images from the original result were not included. Successful sheath exchange and placement of new temporary transvenous pacemaker Elder Negus, MD Pager: [phone] Office: [phone]  CARDIAC CATHETERIZATION  Addendum Date: 06/10/2021   Right IJ venous access Successful temporary pacemaker placement Elder Negus, MD Pager: [phone] Office: [phone]   Result Date: 06/10/2021 Images from the original  result were not included. Right IJ venous access Successful temporary pacemaker placement Elder Negus, MD Pager: [phone] Office: [phone]  DG Chest Port 1 View  Result Date: 06/10/2021 CLINICAL DATA:  Temporary transvenous cardiac pacemaker EXAM: PORTABLE CHEST 1 VIEW COMPARISON:  Chest radiograph obtained earlier the same day FINDINGS: There is an right IJ venous pacing device terminating over the expected location of the right ventricle, repositioned since the prior radiograph. The cardiomediastinal silhouette is stable. Aeration of the lungs is unchanged. There is no new or worsening focal airspace disease. There is no significant pleural effusion. There is no pneumothorax. There is no acute osseous abnormality. IMPRESSION: Right IJ venous pacing device in place with the tip terminating in the expected  location of the right ventricle. Electronically Signed   By: Lesia Hausen M.D.   On: 06/10/2021 15:12   DG CHEST PORT 1 VIEW  Result Date: 06/10/2021 CLINICAL DATA:  Temporary pacemaker EXAM: PORTABLE CHEST 1 VIEW COMPARISON:  06/09/2021 FINDINGS: Right IJ transvenous pacer again identified. Lead again overlies the right ventricle. Decreased pulmonary edema. No pleural effusion or pneumothorax. Stable cardiomediastinal contours. IMPRESSION: Decreased pulmonary edema. Electronically Signed   By: Guadlupe Spanish M.D.   On: 06/10/2021 08:05   DG Chest Port 1 View  Result Date: 06/09/2021 CLINICAL DATA:  Temporary transvenous pacer placement. EXAM: PORTABLE CHEST 1 VIEW COMPARISON:  Jun 09, 2021. FINDINGS: EKG leads project over the chest. Interval placement of RIGHT IJ transvenous pacer device, single lead projects over the cardiac silhouette in the area of RIGHT ventricle or even towards RIGHT ventricular outflow tract, this is not clear. Heart size is stable. No lobar consolidation. Fullness of the RIGHT and LEFT hilum with vascular congestion and mild increased interstitial markings. No pneumothorax. Subtle basilar airspace disease bilaterally. On limited assessment there is no acute skeletal finding. IMPRESSION: 1. Interval placement of RIGHT IJ transvenous pacer device with single lead projecting over the cardiac silhouette in the area of RIGHT ventricle or even towards RIGHT ventricular outflow tract, this is not clear. Correlate with function and with lateral view as warranted for further assessment. 2. Vascular congestion with mild interstitial edema and bibasilar airspace disease, likely atelectasis. Electronically Signed   By: Donzetta Kohut M.D.   On: 06/09/2021 16:08   DG Chest Port 1 View  Result Date: 06/09/2021 CLINICAL DATA:  Shortness of breath and weakness.  Bradycardia. EXAM: PORTABLE CHEST 1 VIEW COMPARISON:  11/19/2020. FINDINGS: Trachea is midline. Heart size stable. Defibrillator pads  project over the left mid chest and epigastric region. Mild interstitial prominence and indistinctness bilaterally. No dense airspace consolidation or definite pleural fluid. IMPRESSION: Mild interstitial prominence and indistinctness, suspicious for pulmonary edema. Electronically Signed   By: Leanna Battles M.D.   On: 06/09/2021 10:15   ECHOCARDIOGRAM COMPLETE  Result Date: 06/09/2021    ECHOCARDIOGRAM REPORT   Patient Name:   Felicia Davidson Date of Exam: 06/09/2021 Medical Rec #:  [id number]      Height:       63.0 in Accession #:    [id number]     Weight:       199.0 lb Date of Birth:  1945-11-29     BSA:          1.930 m Patient Age:    75 years       BP:           139/50 mmHg Patient Gender: F  HR:           35 bpm. Exam Location:  Inpatient Procedure: 2D Echo, Cardiac Doppler and Color Doppler Indications:     Heart block, Complete I44.2  History:         Patient has prior history of Echocardiogram examinations, most                  recent 11/19/2020. Risk Factors:Diabetes.  Sonographer:     Leta Jungling RDCS Referring Phys:  [id number] Lynnell Catalan Diagnosing Phys: Truett Mainland MD IMPRESSIONS  1. Left ventricular ejection fraction, by estimation, is 60 to 65%. The left ventricle has normal function. The left ventricle has no regional wall motion abnormalities. There is mild left ventricular hypertrophy. Left ventricular diastolic function not  assessed due to complete AV blocl.  2. Right ventricular systolic function is normal. The right ventricular size is normal.  3. The mitral valve is normal in structure. Mild mitral valve regurgitation. No evidence of mitral stenosis.  4. The aortic valve is normal in structure. Aortic valve regurgitation is mild. No aortic stenosis is present. FINDINGS  Left Ventricle: Left ventricular ejection fraction, by estimation, is 60 to 65%. The left ventricle has normal function. The left ventricle has no regional wall motion abnormalities. The left  ventricular internal cavity size was normal in size. There is  mild left ventricular hypertrophy. Left ventricular diastolic parameters are indeterminate. Right Ventricle: The right ventricular size is normal. No increase in right ventricular wall thickness. Right ventricular systolic function is normal. Left Atrium: Left atrial size was normal in size. Right Atrium: Right atrial size was normal in size. Pericardium: There is no evidence of pericardial effusion. Mitral Valve: The mitral valve is normal in structure. Mild mitral valve regurgitation. No evidence of mitral valve stenosis. Tricuspid Valve: The tricuspid valve is normal in structure. Tricuspid valve regurgitation is mild . No evidence of tricuspid stenosis. Aortic Valve: The aortic valve is normal in structure. Aortic valve regurgitation is mild. Aortic regurgitation PHT measures 876 msec. No aortic stenosis is present. Aortic valve mean gradient measures 18.0 mmHg. Aortic valve peak gradient measures 27.2 mmHg. Aortic valve area, by VTI measures 1.70 cm. Pulmonic Valve: The pulmonic valve was normal in structure. Pulmonic valve regurgitation is trivial. Aorta: The aortic root is normal in size and structure. IAS/Shunts: The interatrial septum was not assessed.  LEFT VENTRICLE PLAX 2D LVIDd:         4.80 cm   Diastology LVIDs:         3.00 cm   LV e' medial:    8.17 cm/s LV PW:         1.20 cm   LV E/e' medial:  15.7 LV IVS:        1.20 cm   LV e' lateral:   14.60 cm/s LVOT diam:     1.90 cm   LV E/e' lateral: 8.8 LV SV:         120 LV SV Index:   62 LVOT Area:     2.84 cm  RIGHT VENTRICLE RV S prime:     16.20 cm/s TAPSE (M-mode): 2.2 cm LEFT ATRIUM             Index        RIGHT ATRIUM           Index LA diam:        4.20 cm 2.18 cm/m   RA Area:     15.80 cm LA  Vol [facility]):   39.7 ml 20.57 ml/m  RA Volume:   36.30 ml  18.81 ml/m LA Vol (A4C):   41.5 ml 21.51 ml/m LA Biplane Vol: 41.4 ml 21.46 ml/m  AORTIC VALVE AV Area (Vmax):    1.79 cm AV Area  (Vmean):   1.62 cm AV Area (VTI):     1.70 cm AV Vmax:           261.00 cm/s AV Vmean:          201.000 cm/s AV VTI:            0.704 m AV Peak Grad:      27.2 mmHg AV Mean Grad:      18.0 mmHg LVOT Vmax:         165.00 cm/s LVOT Vmean:        115.000 cm/s LVOT VTI:          0.423 m LVOT/AV VTI ratio: 0.60 AI PHT:            876 msec  AORTA Ao Root diam: 3.00 cm Ao Asc diam:  3.50 cm MITRAL VALVE MV Area (PHT): 3.77 cm     SHUNTS MV Decel Time: 201 msec     Systemic VTI:  0.42 m MV E velocity: 128.50 cm/s  Systemic Diam: 1.90 cm MV A velocity: 107.50 cm/s MV E/A ratio:  1.20 Manish Patwardhan MD Electronically signed by Truett Mainland MD Signature Date/Time: 06/09/2021/2:41:21 PM    Final     Patient Profile     Felicia Davidson is a 76 y.o. female with a history of DM2, Neuropathy, Anemia, HTN, and HLD who is being seen today for the evaluation of CHB at the request of Dr. Rosemary Holms.  Assessment & Plan    1.  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 Without reversible cause. Held BB without resolution On dobutamine  Echo 06/09/2021 EF 55-60% Explained risks, benefits, and alternatives to PPM implantation, including but not limited to bleeding, infection, pneumothorax, pericardial effusion, lead dislodgement, heart attack, stroke, or death.  Pt verbalized understanding and agrees to proceed.    2. DM2 A1c 5.9   3. AKI Cr 2.51 -> 2.1 today baseline appears 0.9 - 1.1  4. Anemia Hgb 8.7. Chronic component, no obvious active bleeding.  FOBT when able.   Dr. Elberta Fortis has seen. On for pacing this afternoon.   For questions or updates, please contact [facility] Please consult [email or web address] for contact info under Cardiology/STEMI.  Signed, Graciella Freer, PA-C  06/11/2021, 7:23 AM   I have seen and examined this patient with Otilio Saber.  Agree with above, note added to reflect my findings.  Remains in complete heart block.  No chest pain or shortness of breath.  GEN:  Well nourished, well developed, in no acute distress  HEENT: normal  Neck: no JVD, carotid bruits, or masses Cardiac: RRR; no murmurs, rubs, or gallops,no edema  Respiratory:  clear to auscultation bilaterally, normal work of breathing GI: soft, nontender, nondistended, + BS MS: no deformity or atrophy  Skin: warm and dry Neuro:  Strength and sensation are intact Psych: euthymic mood, full affect   Complete heart block: Temporary pacemaker wire in place.  Carvedilol has washed out.  Plan for pacemaker implant.  Risk and benefits of been discussed with severe bleeding, tamponade, infection, pneumothorax, lead dislodgment.  She understands his risks and is agreed to the procedure.  Emmamae Mcnamara M. Charity Tessier MD 06/11/2021 2:57 PM

## 2021-06-11 NOTE — Interval H&P Note (Signed)
History and Physical Interval Note:  06/11/2021 2:58 PM  Felicia Davidson  has presented today for surgery, with the diagnosis of complete heart block.  The various methods of treatment have been discussed with the patient and family. After consideration of risks, benefits and other options for treatment, the patient has consented to  Procedure(s): PACEMAKER IMPLANT (N/A) as a surgical intervention.  The patient's history has been reviewed, patient examined, no change in status, stable for surgery.  I have reviewed the patient's chart and labs.  Questions were answered to the patient's satisfaction.     Tatjana Turcott [employer]

## 2021-06-11 NOTE — H&P (View-Only) (Signed)
Electrophysiology Rounding Note  Patient Name: Felicia Davidson Date of Encounter: 06/11/2021  Primary Cardiologist: None Electrophysiologist: New   Subjective   NAEO after temp wire repositioned yesterday afternoon.   Inpatient Medications    Scheduled Meds:  Chlorhexidine Gluconate Cloth  6 each Topical Daily   insulin aspart  0-9 Units Subcutaneous Q4H   mouth rinse  15 mL Mouth Rinse BID   rosuvastatin  10 mg Oral Daily   Continuous Infusions:  sodium chloride 10 mL/hr at 06/11/21 0600   sodium chloride 10 mL/hr at 06/10/21 1417   DOBUTamine 5 mcg/kg/min (06/11/21 0600)   PRN Meds: sodium chloride, sodium chloride, acetaminophen, docusate sodium, polyethylene glycol   Vital Signs    Vitals:   06/11/21 0645 06/11/21 0650 06/11/21 0655 06/11/21 0700  BP:      Pulse: (!) 0 (!) 0 (!) 0 (!) 0  Resp:      Temp:      TempSrc:      SpO2:      Weight:      Height:        Intake/Output Summary (Last 24 hours) at 06/11/2021 0723 Last data filed at 06/11/2021 0600 Gross per 24 hour  Intake 237.17 ml  Output 1050 ml  Net -812.83 ml   Filed Weights   06/09/21 1003  Weight: 90.3 kg    Physical Exam    GEN- The patient is well appearing, alert and oriented x 3 today.   Head- normocephalic, atraumatic Eyes-  Sclera clear, conjunctiva pink Ears- hearing intact Oropharynx- clear Neck- supple Lungs- Clear to ausculation bilaterally, normal work of breathing Heart- Regular rate and rhythm, no murmurs, rubs or gallops GI- soft, NT, ND, + BS Extremities- no clubbing or cyanosis. No edema Skin- no rash or lesion Psych- euthymic mood, full affect Neuro- strength and sensation are intact  Labs    CBC Recent Labs    06/09/21 1004 06/09/21 1228 06/10/21 0100 06/11/21 0038  WBC 13.8*   < > 10.9* 9.4  NEUTROABS 11.1*  --   --   --   HGB 9.2*   < > 8.6* 8.7*  HCT 29.3*   < > 25.7* 26.9*  MCV 95.1   < > 90.2 92.1  PLT 218   < > 214 215   < > = values in this  interval not displayed.   Basic Metabolic Panel Recent Labs    25/05/39 0103 06/11/21 0038  NA 134* 136  K 4.6 4.5  CL 104 106  CO2 19* 22  GLUCOSE 108* 97  BUN 63* 54*  CREATININE 2.51* 2.11*  CALCIUM 8.6* 8.2*  MG 1.8 2.2   Liver Function Tests Recent Labs    06/09/21 1004  AST 14*  ALT 11  ALKPHOS 89  BILITOT 0.8  PROT 6.4*  ALBUMIN 3.3*   No results for input(s): LIPASE, AMYLASE in the last 72 hours. Cardiac Enzymes No results for input(s): CKTOTAL, CKMB, CKMBINDEX, TROPONINI in the last 72 hours.   Telemetry    V paced at 60 , underlying CHB (personally reviewed)  Radiology    CARDIAC CATHETERIZATION  Result Date: 06/10/2021 Images from the original result were not included. Successful sheath exchange and placement of new temporary transvenous pacemaker Elder Negus, MD Pager: [phone] Office: [phone]  CARDIAC CATHETERIZATION  Addendum Date: 06/10/2021   Right IJ venous access Successful temporary pacemaker placement Elder Negus, MD Pager: [phone] Office: [phone]   Result Date: 06/10/2021 Images from the original  result were not included. Right IJ venous access Successful temporary pacemaker placement Elder Negus, MD Pager: [phone] Office: [phone]  DG Chest Port 1 View  Result Date: 06/10/2021 CLINICAL DATA:  Temporary transvenous cardiac pacemaker EXAM: PORTABLE CHEST 1 VIEW COMPARISON:  Chest radiograph obtained earlier the same day FINDINGS: There is an right IJ venous pacing device terminating over the expected location of the right ventricle, repositioned since the prior radiograph. The cardiomediastinal silhouette is stable. Aeration of the lungs is unchanged. There is no new or worsening focal airspace disease. There is no significant pleural effusion. There is no pneumothorax. There is no acute osseous abnormality. IMPRESSION: Right IJ venous pacing device in place with the tip terminating in the expected  location of the right ventricle. Electronically Signed   By: Lesia Hausen M.D.   On: 06/10/2021 15:12   DG CHEST PORT 1 VIEW  Result Date: 06/10/2021 CLINICAL DATA:  Temporary pacemaker EXAM: PORTABLE CHEST 1 VIEW COMPARISON:  06/09/2021 FINDINGS: Right IJ transvenous pacer again identified. Lead again overlies the right ventricle. Decreased pulmonary edema. No pleural effusion or pneumothorax. Stable cardiomediastinal contours. IMPRESSION: Decreased pulmonary edema. Electronically Signed   By: Guadlupe Spanish M.D.   On: 06/10/2021 08:05   DG Chest Port 1 View  Result Date: 06/09/2021 CLINICAL DATA:  Temporary transvenous pacer placement. EXAM: PORTABLE CHEST 1 VIEW COMPARISON:  Jun 09, 2021. FINDINGS: EKG leads project over the chest. Interval placement of RIGHT IJ transvenous pacer device, single lead projects over the cardiac silhouette in the area of RIGHT ventricle or even towards RIGHT ventricular outflow tract, this is not clear. Heart size is stable. No lobar consolidation. Fullness of the RIGHT and LEFT hilum with vascular congestion and mild increased interstitial markings. No pneumothorax. Subtle basilar airspace disease bilaterally. On limited assessment there is no acute skeletal finding. IMPRESSION: 1. Interval placement of RIGHT IJ transvenous pacer device with single lead projecting over the cardiac silhouette in the area of RIGHT ventricle or even towards RIGHT ventricular outflow tract, this is not clear. Correlate with function and with lateral view as warranted for further assessment. 2. Vascular congestion with mild interstitial edema and bibasilar airspace disease, likely atelectasis. Electronically Signed   By: Donzetta Kohut M.D.   On: 06/09/2021 16:08   DG Chest Port 1 View  Result Date: 06/09/2021 CLINICAL DATA:  Shortness of breath and weakness.  Bradycardia. EXAM: PORTABLE CHEST 1 VIEW COMPARISON:  11/19/2020. FINDINGS: Trachea is midline. Heart size stable. Defibrillator pads  project over the left mid chest and epigastric region. Mild interstitial prominence and indistinctness bilaterally. No dense airspace consolidation or definite pleural fluid. IMPRESSION: Mild interstitial prominence and indistinctness, suspicious for pulmonary edema. Electronically Signed   By: Leanna Battles M.D.   On: 06/09/2021 10:15   ECHOCARDIOGRAM COMPLETE  Result Date: 06/09/2021    ECHOCARDIOGRAM REPORT   Patient Name:   Felicia Davidson Date of Exam: 06/09/2021 Medical Rec #:  [id number]      Height:       63.0 in Accession #:    [id number]     Weight:       199.0 lb Date of Birth:  1945-11-29     BSA:          1.930 m Patient Age:    75 years       BP:           139/50 mmHg Patient Gender: F  HR:           35 bpm. Exam Location:  Inpatient Procedure: 2D Echo, Cardiac Doppler and Color Doppler Indications:     Heart block, Complete I44.2  History:         Patient has prior history of Echocardiogram examinations, most                  recent 11/19/2020. Risk Factors:Diabetes.  Sonographer:     Tiffany Cooper RDCS Referring Phys:  [id number] RAVI AGARWALA Diagnosing Phys: Manish Patwardhan MD IMPRESSIONS  1. Left ventricular ejection fraction, by estimation, is 60 to 65%. The left ventricle has normal function. The left ventricle has no regional wall motion abnormalities. There is mild left ventricular hypertrophy. Left ventricular diastolic function not  assessed due to complete AV blocl.  2. Right ventricular systolic function is normal. The right ventricular size is normal.  3. The mitral valve is normal in structure. Mild mitral valve regurgitation. No evidence of mitral stenosis.  4. The aortic valve is normal in structure. Aortic valve regurgitation is mild. No aortic stenosis is present. FINDINGS  Left Ventricle: Left ventricular ejection fraction, by estimation, is 60 to 65%. The left ventricle has normal function. The left ventricle has no regional wall motion abnormalities. The left  ventricular internal cavity size was normal in size. There is  mild left ventricular hypertrophy. Left ventricular diastolic parameters are indeterminate. Right Ventricle: The right ventricular size is normal. No increase in right ventricular wall thickness. Right ventricular systolic function is normal. Left Atrium: Left atrial size was normal in size. Right Atrium: Right atrial size was normal in size. Pericardium: There is no evidence of pericardial effusion. Mitral Valve: The mitral valve is normal in structure. Mild mitral valve regurgitation. No evidence of mitral valve stenosis. Tricuspid Valve: The tricuspid valve is normal in structure. Tricuspid valve regurgitation is mild . No evidence of tricuspid stenosis. Aortic Valve: The aortic valve is normal in structure. Aortic valve regurgitation is mild. Aortic regurgitation PHT measures 876 msec. No aortic stenosis is present. Aortic valve mean gradient measures 18.0 mmHg. Aortic valve peak gradient measures 27.2 mmHg. Aortic valve area, by VTI measures 1.70 cm. Pulmonic Valve: The pulmonic valve was normal in structure. Pulmonic valve regurgitation is trivial. Aorta: The aortic root is normal in size and structure. IAS/Shunts: The interatrial septum was not assessed.  LEFT VENTRICLE PLAX 2D LVIDd:         4.80 cm   Diastology LVIDs:         3.00 cm   LV e' medial:    8.17 cm/s LV PW:         1.20 cm   LV E/e' medial:  15.7 LV IVS:        1.20 cm   LV e' lateral:   14.60 cm/s LVOT diam:     1.90 cm   LV E/e' lateral: 8.8 LV SV:         120 LV SV Index:   62 LVOT Area:     2.84 cm  RIGHT VENTRICLE RV S prime:     16.20 cm/s TAPSE (M-mode): 2.2 cm LEFT ATRIUM             Index        RIGHT ATRIUM           Index LA diam:        4.20 cm 2.18 cm/m   RA Area:     15.80 cm LA   Vol [facility]):   39.7 ml 20.57 ml/m  RA Volume:   36.30 ml  18.81 ml/m LA Vol (A4C):   41.5 ml 21.51 ml/m LA Biplane Vol: 41.4 ml 21.46 ml/m  AORTIC VALVE AV Area (Vmax):    1.79 cm AV Area  (Vmean):   1.62 cm AV Area (VTI):     1.70 cm AV Vmax:           261.00 cm/s AV Vmean:          201.000 cm/s AV VTI:            0.704 m AV Peak Grad:      27.2 mmHg AV Mean Grad:      18.0 mmHg LVOT Vmax:         165.00 cm/s LVOT Vmean:        115.000 cm/s LVOT VTI:          0.423 m LVOT/AV VTI ratio: 0.60 AI PHT:            876 msec  AORTA Ao Root diam: 3.00 cm Ao Asc diam:  3.50 cm MITRAL VALVE MV Area (PHT): 3.77 cm     SHUNTS MV Decel Time: 201 msec     Systemic VTI:  0.42 m MV E velocity: 128.50 cm/s  Systemic Diam: 1.90 cm MV A velocity: 107.50 cm/s MV E/A ratio:  1.20 Manish Patwardhan MD Electronically signed by Truett Mainland MD Signature Date/Time: 06/09/2021/2:41:21 PM    Final     Patient Profile     Felicia Davidson is a 76 y.o. female with a history of DM2, Neuropathy, Anemia, HTN, and HLD who is being seen today for the evaluation of CHB at the request of Dr. Rosemary Holms.  Assessment & Plan    1.  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 Without reversible cause. Held BB without resolution On dobutamine  Echo 06/09/2021 EF 55-60% Explained risks, benefits, and alternatives to PPM implantation, including but not limited to bleeding, infection, pneumothorax, pericardial effusion, lead dislodgement, heart attack, stroke, or death.  Pt verbalized understanding and agrees to proceed.    2. DM2 A1c 5.9   3. AKI Cr 2.51 -> 2.1 today baseline appears 0.9 - 1.1  4. Anemia Hgb 8.7. Chronic component, no obvious active bleeding.  FOBT when able.   Dr. Elberta Fortis has seen. On for pacing this afternoon.   For questions or updates, please contact [facility] Please consult [email or web address] for contact info under Cardiology/STEMI.  Signed, Graciella Freer, PA-C  06/11/2021, 7:23 AM   I have seen and examined this patient with Otilio Saber.  Agree with above, note added to reflect my findings.  Remains in complete heart block.  No chest pain or shortness of breath.  GEN:  Well nourished, well developed, in no acute distress  HEENT: normal  Neck: no JVD, carotid bruits, or masses Cardiac: RRR; no murmurs, rubs, or gallops,no edema  Respiratory:  clear to auscultation bilaterally, normal work of breathing GI: soft, nontender, nondistended, + BS MS: no deformity or atrophy  Skin: warm and dry Neuro:  Strength and sensation are intact Psych: euthymic mood, full affect   Complete heart block: Temporary pacemaker wire in place.  Carvedilol has washed out.  Plan for pacemaker implant.  Risk and benefits of been discussed with severe bleeding, tamponade, infection, pneumothorax, lead dislodgment.  She understands his risks and is agreed to the procedure.  Ranie Chinchilla M. Nidal Rivet MD 06/11/2021 2:57 PM

## 2021-06-11 NOTE — Discharge Instructions (Addendum)
Please note that you Coreg dose changed from 20 to 6.25 Also please schedule a follow up appointment with you PCP next week for repeat lab work    After Your Pacemaker   You have a [employer] Pacemaker  ACTIVITY Do not lift your arm above shoulder height for 1 week after your procedure. After 7 days, you may progress as below.  You should remove your sling 24 hours after your procedure, unless otherwise instructed by your provider.     Friday June 18, 2021  Saturday June 19, 2021 Sunday June 20, 2021 Monday June 21, 2021   Do not lift, push, pull, or carry anything over 10 pounds with the affected arm until 6 weeks (Friday July 23, 2021 ) after your procedure.   You may drive AFTER your wound check, unless you have been told otherwise by your provider.   Ask your healthcare provider when you can go back to work   INCISION/Dressing If you are on a blood thinner such as Coumadin, Xarelto, Eliquis, Plavix, or Pradaxa please confirm with your provider when this should be resumed.   If large square, outer bandage is left in place, this can be removed after 24 hours from your procedure. Do not remove steri-strips or glue as below.   Monitor your Pacemaker site for redness, swelling, and drainage. Call the device clinic at [phone] if you experience these symptoms or fever/chills.  If your incision is sealed with Steri-strips or staples, you may shower 7 days after your procedure or when told by your provider. Do not remove the steri-strips or let the shower hit directly on your site. You may wash around your site with soap and water.    If you were discharged in a sling, please do not wear this during the day more than 48 hours after your surgery unless otherwise instructed. This may increase the risk of stiffness and soreness in your shoulder.   Avoid lotions, ointments, or perfumes over your incision until it is well-healed.  You may use a hot tub or a pool AFTER your wound check  appointment if the incision is completely closed.  Pacemaker Alerts:  Some alerts are vibratory and others beep. These are NOT emergencies. Please call our office to let us know. If this occurs at night or on weekends, it can wait until the next business day. Send a remote transmission.  If your device is capable of reading fluid status (for heart failure), you will be offered monthly monitoring to review this with you.   DEVICE MANAGEMENT Remote monitoring is used to monitor your pacemaker from home. This monitoring is scheduled every 91 days by our office. It allows [place] to keep an eye on the functioning of your device to ensure it is working properly. You will routinely see your Electrophysiologist annually (more often if necessary).   You should receive your ID card for your new device in 4-8 weeks. Keep this card with you at all times once received. Consider wearing a medical alert bracelet or necklace.  Your Pacemaker may be MRI compatible. This will be discussed at your next office visit/wound check.  You should avoid contact with strong electric or magnetic fields.   Do not use amateur (ham) radio equipment or electric (arc) welding torches. MP3 player headphones with magnets should not be used. Some devices are safe to use if held at least 12 inches (30 cm) from your Pacemaker. These include power tools, lawn mowers, and speakers. If you are  unsure if something is safe to use, ask your health care provider.  When using your cell phone, hold it to the ear that is on the opposite side from the Pacemaker. Do not leave your cell phone in a pocket over the Pacemaker.  You may safely use electric blankets, heating pads, computers, and microwave ovens.  Call the office right away if: You have chest pain. You feel more short of breath than you have felt before. You feel more light-headed than you have felt before. Your incision starts to open up.  This information is not intended to replace  advice given to you by your health care provider. Make sure you discuss any questions you have with your health care provider.

## 2021-06-12 ENCOUNTER — Inpatient Hospital Stay (HOSPITAL_COMMUNITY): Payer: Medicare Other

## 2021-06-12 DIAGNOSIS — I442 Atrioventricular block, complete: Secondary | ICD-10-CM | POA: Diagnosis not present

## 2021-06-12 LAB — GLUCOSE, CAPILLARY
Glucose-Capillary: 117 mg/dL — ABNORMAL HIGH (ref 70–99)
Glucose-Capillary: 116 mg/dL — ABNORMAL HIGH (ref 70–99)
Glucose-Capillary: 178 mg/dL — ABNORMAL HIGH (ref 70–99)
Glucose-Capillary: 143 mg/dL — ABNORMAL HIGH (ref 70–99)
Glucose-Capillary: 178 mg/dL — ABNORMAL HIGH (ref 70–99)

## 2021-06-12 LAB — COMPREHENSIVE METABOLIC PANEL
ALT: 10 U/L (ref 0–44)
AST: 10 U/L — ABNORMAL LOW (ref 15–41)
Albumin: 2.6 g/dL — ABNORMAL LOW (ref 3.5–5.0)
Alkaline Phosphatase: 81 U/L (ref 38–126)
Anion gap: 7 (ref 5–15)
BUN: 45 mg/dL — ABNORMAL HIGH (ref 8–23)
CO2: 23 mmol/L (ref 22–32)
Calcium: 8.5 mg/dL — ABNORMAL LOW (ref 8.9–10.3)
Chloride: 108 mmol/L (ref 98–111)
Creatinine, Ser: 1.7 mg/dL — ABNORMAL HIGH (ref 0.44–1.00)
GFR, Estimated: 31 mL/min — ABNORMAL LOW (ref >60)
Glucose, Bld: 134 mg/dL — ABNORMAL HIGH (ref 70–99)
Potassium: 4.2 mmol/L (ref 3.5–5.1)
Sodium: 138 mmol/L (ref 135–145)
Total Bilirubin: 0.5 mg/dL (ref 0.3–1.2)
Total Protein: 5.9 g/dL — ABNORMAL LOW (ref 6.5–8.1)

## 2021-06-12 LAB — CBC
HCT: 29 % — ABNORMAL LOW (ref 36.0–46.0)
Hemoglobin: 9.4 g/dL — ABNORMAL LOW (ref 12.0–15.0)
MCH: 29.8 pg (ref 26.0–34.0)
MCHC: 32.4 g/dL (ref 30.0–36.0)
MCV: 92.1 fL (ref 80.0–100.0)
Platelets: 248 10*3/uL (ref 150–400)
RBC: 3.15 MIL/uL — ABNORMAL LOW (ref 3.87–5.11)
RDW: 13.2 % (ref 11.5–15.5)
WBC: 10.6 10*3/uL — ABNORMAL HIGH (ref 4.0–10.5)
nRBC: 0 % (ref 0.0–0.2)

## 2021-06-12 MED ORDER — INSULIN ASPART 100 UNIT/ML IJ SOLN: 0.0000 [IU] | Freq: Every day | INTRAMUSCULAR | Status: DC

## 2021-06-12 MED ORDER — INSULIN ASPART 100 UNIT/ML IJ SOLN
0.0000 [IU] | Freq: Three times a day (TID) | INTRAMUSCULAR | Status: DC
  Administered 2021-06-13: 2 [IU] via SUBCUTANEOUS
  Administered 2021-06-13: 3 [IU] via SUBCUTANEOUS

## 2021-06-12 NOTE — Progress Notes (Signed)
Progress Note  Patient Name: Felicia Davidson Date of Encounter: 06/12/2021  [facility] Cardiologist: None   Subjective   NAEO. Doing well after PPM implant yesterday.  Inpatient Medications    Scheduled Meds:  Chlorhexidine Gluconate Cloth  6 each Topical Daily   insulin aspart  0-9 Units Subcutaneous Q4H   mouth rinse  15 mL Mouth Rinse BID   rosuvastatin  10 mg Oral Daily   Continuous Infusions:  sodium chloride 10 mL/hr at 06/10/21 1417   PRN Meds: sodium chloride, acetaminophen, docusate sodium, ondansetron (ZOFRAN) IV, polyethylene glycol   Vital Signs    Vitals:   06/12/21 0700 06/12/21 0715 06/12/21 0800 06/12/21 0805  BP: 101/61   (!) 137/53  Pulse: 86 95 80 85  Resp: 19 15 17  (!) 26  Temp:  98.1 F (36.7 C)    TempSrc:  Oral    SpO2: 96% 97% 97% 98%  Weight:      Height:        Intake/Output Summary (Last 24 hours) at 06/12/2021 0824 Last data filed at 06/12/2021 0600 Gross per 24 hour  Intake 777.83 ml  Output 1450 ml  Net -672.17 ml      06/12/2021    6:00 AM 06/09/2021   10:03 AM 11/21/2020    3:36 AM  Last 3 Weights  Weight (lbs) 202 lb 9.6 oz 199 lb 196 lb 10.4 oz  Weight (kg) 91.9 kg 90.266 kg 89.2 kg      Telemetry    Personally Reviewed  ECG    Personally Reviewed  Physical Exam   GEN: No acute distress.   Neck: No JVD Cardiac: RRR, no murmurs, rubs, or gallops. PPM site healing well. Respiratory: Clear to auscultation bilaterally. GI: Soft, nontender, non-distended  MS: No edema; No deformity. Neuro:  Nonfocal  Psych: Normal affect   Labs    High Sensitivity Troponin:   Recent Labs  Lab 06/09/21 1004 06/09/21 1228 06/09/21 1419  TROPONINIHS 16 18* 21*     Chemistry Recent Labs  Lab 06/09/21 1004 06/09/21 1228 06/09/21 1419 06/10/21 0103 06/11/21 0038 06/12/21 0023  NA 130* 130*   < > 134* 136 138  K 6.0* 5.8*   < > 4.6 4.5 4.2  CL 103 101   < > 104 106 108  CO2 17* 18*   < > 19* 22 23  GLUCOSE 128* 141*    < > 108* 97 134*  BUN 67* 67*   < > 63* 54* 45*  CREATININE 2.65* 2.60*   < > 2.51* 2.11* 1.70*  CALCIUM 8.6* 9.8   < > 8.6* 8.2* 8.5*  MG  --  1.8  --  1.8 2.2  --   PROT 6.4*  --   --   --   --  5.9*  ALBUMIN 3.3*  --   --   --   --  2.6*  AST 14*  --   --   --   --  10*  ALT 11  --   --   --   --  10  ALKPHOS 89  --   --   --   --  81  BILITOT 0.8  --   --   --   --  0.5  GFRNONAA 18* 19*   < > 19* 24* 31*  ANIONGAP 10 11   < > 11 8 7    < > = values in this interval not displayed.    Lipids No  results for input(s): CHOL, TRIG, HDL, LABVLDL, LDLCALC, CHOLHDL in the last 168 hours.  Hematology Recent Labs  Lab 06/10/21 0100 06/11/21 0038 06/12/21 0023  WBC 10.9* 9.4 10.6*  RBC 2.85* 2.92* 3.15*  HGB 8.6* 8.7* 9.4*  HCT 25.7* 26.9* 29.0*  MCV 90.2 92.1 92.1  MCH 30.2 29.8 29.8  MCHC 33.5 32.3 32.4  RDW 12.9 13.2 13.2  PLT 214 215 248   Thyroid No results for input(s): TSH, FREET4 in the last 168 hours.  BNP Recent Labs  Lab 06/09/21 1004  BNP 549.0*    DDimer No results for input(s): DDIMER in the last 168 hours.   Radiology    DG Chest 2 View  Result Date: 06/12/2021 CLINICAL DATA:  Follow-up post pacemaker insertion. EXAM: CHEST - 2 VIEW COMPARISON:  06/11/2021 at 5:31 a.m. FINDINGS: 5:05 a.m. 06/12/2021.  There are multiple overlying monitor wires. There is interval new demonstration of a left chest dual lead pacing system with 1 wire terminating in the region of the right atrium and the other in the right ventricle but appears to be in the more proximal rather than the distal ventricle. Heart size and vascular pattern are normal. The mediastinum is normally configured. No pleural effusion is seen. The lungs are clear. Osteopenia and thoracic spondylosis. IMPRESSION: Left chest dual lead pacemaker. One of the wires terminates in the right atrium, the other in the right ventricle but appears to be in the proximal to mid ventricle rather than the usual location of the  distal ventricle. No pneumothorax or other acute chest process. Interval removal right IJ pacing wire. Electronically Signed   By: Almira Bar M.D.   On: 06/12/2021 05:47   CARDIAC CATHETERIZATION  Result Date: 06/10/2021 Images from the original result were not included. Successful sheath exchange and placement of new temporary transvenous pacemaker Elder Negus, MD Pager: [phone] Office: [phone]  EP PPM/ICD IMPLANT  Result Date: 06/11/2021 SURGEON:  Loman Brooklyn, MD   PREPROCEDURE DIAGNOSIS:  complete AV block   POSTPROCEDURE DIAGNOSIS:  complete AV block    PROCEDURES:  1. Pacemaker implantation.   INTRODUCTION:  Felicia Davidson is a 76 y.o. female with a history of bradycardia who presents today for pacemaker implantation.  The patient reports intermittent episodes of dizziness over the past few months.  No reversible causes have been identified.  The patient therefore presents today for pacemaker implantation.   DESCRIPTION OF PROCEDURE:  Informed written consent was obtained, and  the patient was brought to the electrophysiology lab in a fasting state.  The patient required no sedation for the procedure today.  The patients left chest was prepped and draped in the usual sterile fashion by the EP lab staff. The skin overlying the left deltopectoral region was infiltrated with lidocaine for local analgesia.  A 4-cm incision was made over the left deltopectoral region.  A left subcutaneous pacemaker pocket was fashioned using a combination of sharp and blunt dissection. Electrocautery was required to assure hemostasis.    RA/RV Lead Placement: The left axillary vein was therefore cannulated.  Through the left axillary vein, a [employer] model Tendril MRI [id number] (serial number  [id number]) right atrial lead and an [employer] model Tendril MRI [phone] (serial number  [id number]) right ventricular lead were advanced with fluoroscopic visualization into the right atrial appendage and  left bundle positions respectively.  Initial atrial lead P- waves measured 2.8 mV with impedance of 509 ohms and a threshold of 1.3 V at 0.5 msec.  Right ventricular lead R-waves measured 12.5 mV paced with an impedance of 749 ohms and a threshold of 1.3 V at 0.5 msec.  Both leads were secured to the pectoralis fascia using #2-0 silk over the suture sleeves. Device Placement:  The leads were then connected to an [employer] Medical Assurity MRI  model [id number] (serial number  [id number] ) pacemaker.  The pocket was irrigated with copious gentamicin solution.  The pacemaker was then placed into the pocket.  The pocket was then closed in 3 layers with 2.0 Vicryl suture for the 3.0 Vicryl suture subcutaneous and subcuticular layers.  Steri-  Strips and a sterile dressing were then applied. EBL<65ml.  There were no early apparent complications.   CONCLUSIONS:  1. Successful implantation of a [employer] Assurity MRI dual-chamber pacemaker for symptomatic bradycardia  2. No early apparent complications.       Loman Brooklyn, MD 06/11/2021 4:35 PM   DG Chest Port 1 View  Result Date: 06/11/2021 CLINICAL DATA:  Pulmonary edema. EXAM: PORTABLE CHEST 1 VIEW COMPARISON:  June 10, 2021. FINDINGS: Stable cardiomediastinal silhouette. Stable position of right internal jugular pacing device with tip projected over expected position of right ventricle. Lungs are clear. Bony thorax is unremarkable IMPRESSION: No active disease. Electronically Signed   By: Lupita Raider M.D.   On: 06/11/2021 07:40   DG Chest Port 1 View  Result Date: 06/10/2021 CLINICAL DATA:  Temporary transvenous cardiac pacemaker EXAM: PORTABLE CHEST 1 VIEW COMPARISON:  Chest radiograph obtained earlier the same day FINDINGS: There is an right IJ venous pacing device terminating over the expected location of the right ventricle, repositioned since the prior radiograph. The cardiomediastinal silhouette is stable. Aeration of the lungs is unchanged. There is no new or  worsening focal airspace disease. There is no significant pleural effusion. There is no pneumothorax. There is no acute osseous abnormality. IMPRESSION: Right IJ venous pacing device in place with the tip terminating in the expected location of the right ventricle. Electronically Signed   By: Lesia Hausen M.D.   On: 06/10/2021 15:12     Assessment & Plan    Ms Guadalupe is a pleasant 75yo woman who is being seen for complete heart block. She presented to her PCP 06/09/2021 with fatigue for several days. In the ER she was in CHB. Labwork with acute kidney injury (Cr 2.6 from 0.9) complicated by hyperkalemia. Lokelma was given and she was taken for a R IJ temp wire. No arrhythmic syncope history. She is now s/p PPM 06/11/2021.  #CHB Doing well after PPM. Device interrogation shows stable device function. CXR with stable lead position.  OK to discharge from an EP perspective.    For questions or updates, please contact [facility] Please consult [email or web address] for contact info under        Signed, Lanier Prude, MD  06/12/2021, 8:24 AM

## 2021-06-12 NOTE — Plan of Care (Signed)

## 2021-06-12 NOTE — Evaluation (Signed)
Physical Therapy Evaluation Patient Details Name: Felicia Davidson MRN: [id number] DOB: [birth year] Today's Date: 06/12/2021  History of Present Illness  76 y.o. female presents to [facility] hospital on 06/09/2021 with DOE, weakness and BLE swelling. Pt found to be in complete heart block with ventricular rate in the 30s. Temporary pacemaker placed 6/1. Pt underwent pacemaker implant on 06/11/2021. PMH includes HTN, DMII, anemia, COVID.  Clinical Impression  Pt presents to PT with deficits in functional mobility, gait, balance, endurance, power. Pt limited by PPM precautions, benefits from verbal cues for hand placement during transfers. Pt is able to ambulate steadily with support of RW for balance. Pt is encouraged to mobilize frequently with staff assistance for the remainder of this admission. Pt will benefit from bed mobility and stair training next session, along with continued reinforcement of PPM precautions.       Recommendations for follow up therapy are one component of a multi-disciplinary discharge planning process, led by the attending physician.  Recommendations may be updated based on patient status, additional functional criteria and insurance authorization.  Follow Up Recommendations No PT follow up    Assistance Recommended at Discharge PRN  Patient can return home with the following  Help with stairs or ramp for entrance;A little help with bathing/dressing/bathroom;Assistance with cooking/housework;Assist for transportation    Equipment Recommendations None recommended by PT  Recommendations for Other Services       Functional Status Assessment Patient has had a recent decline in their functional status and demonstrates the ability to make significant improvements in function in a reasonable and predictable amount of time.     Precautions / Restrictions Precautions Precautions: Fall;ICD/Pacemaker Restrictions Weight Bearing Restrictions: Yes LUE Weight Bearing:  (pacemaker  precautions)      Mobility  Bed Mobility               General bed mobility comments: pt received and left in recliner    Transfers Overall transfer level: Needs assistance Equipment used: Rolling walker (2 wheels) Transfers: Sit to/from Stand Sit to Stand: Min guard           General transfer comment: verbal cues for hand placement and rocking to gain momentum    Ambulation/Gait Ambulation/Gait assistance: Supervision Gait Distance (Feet): 200 Feet Assistive device: Rolling walker (2 wheels) Gait Pattern/deviations: Step-through pattern Gait velocity: functional Gait velocity interpretation: 1.31 - 2.62 ft/sec, indicative of limited community ambulator   General Gait Details: pt with steady step-through gait, cues to improve posture  Stairs            Wheelchair Mobility    Modified Rankin (Stroke Patients Only)       Balance Overall balance assessment: Needs assistance Sitting-balance support: No upper extremity supported, Feet supported Sitting balance-Leahy Scale: Good     Standing balance support: Single extremity supported, Reliant on assistive device for balance Standing balance-Leahy Scale: Poor                               Pertinent Vitals/Pain Pain Assessment Pain Assessment: No/denies pain    Home Living Family/patient expects to be discharged to:: Private residence Living Arrangements: Spouse/significant other Available Help at Discharge: Family;Available 24 hours/day Type of Home: House Home Access: Stairs to enter Entrance Stairs-Rails: Right Entrance Stairs-Number of Steps: 4   Home Layout: One level Home Equipment: Conservation officer, nature (2 wheels);Cane - single point;Shower seat      Prior Function Prior Level of Function :  Independent/Modified Independent;Driving             Mobility Comments: furniture surfs       Hand Dominance   Dominant Hand: Right    Extremity/Trunk Assessment   Upper  Extremity Assessment Upper Extremity Assessment: Overall WFL for tasks assessed (LUE assessment limited by PPM precautions)    Lower Extremity Assessment Lower Extremity Assessment: Generalized weakness    Cervical / Trunk Assessment Cervical / Trunk Assessment: Kyphotic  Communication   Communication: No difficulties  Cognition Arousal/Alertness: Awake/alert Behavior During Therapy: WFL for tasks assessed/performed Overall Cognitive Status: Within Functional Limits for tasks assessed                                          General Comments General comments (skin integrity, edema, etc.): VSS on RA, mlld tachycardia into low 100s    Exercises     Assessment/Plan    PT Assessment Patient needs continued PT services  PT Problem List Decreased strength;Decreased activity tolerance;Decreased balance;Decreased mobility;Decreased knowledge of precautions;Decreased knowledge of use of DME;Cardiopulmonary status limiting activity       PT Treatment Interventions DME instruction;Gait training;Stair training;Functional mobility training;Therapeutic activities;Therapeutic exercise;Neuromuscular re-education;Balance training;Patient/family education    PT Goals (Current goals can be found in the Care Plan section)  Acute Rehab PT Goals Patient Stated Goal: to go home PT Goal Formulation: With patient Time For Goal Achievement: 06/26/21 Potential to Achieve Goals: Good    Frequency Min 3X/week     Co-evaluation               AM-PAC PT "6 Clicks" Mobility  Outcome Measure Help needed turning from your back to your side while in a flat bed without using bedrails?: A Little Help needed moving from lying on your back to sitting on the side of a flat bed without using bedrails?: A Little Help needed moving to and from a bed to a chair (including a wheelchair)?: A Little Help needed standing up from a chair using your arms (e.g., wheelchair or bedside chair)?: A  Little Help needed to walk in hospital room?: A Little Help needed climbing 3-5 steps with a railing? : Total 6 Click Score: 16    End of Session   Activity Tolerance: Patient tolerated treatment well Patient left: in chair;with call bell/phone within reach;with family/visitor present Nurse Communication: Mobility status PT Visit Diagnosis: Other abnormalities of gait and mobility (R26.89);Muscle weakness (generalized) (M62.81)    Time: [id number] PT Time Calculation (min) (ACUTE ONLY): 20 min   Charges:   PT Evaluation $PT Eval Low Complexity: [place], PT, DPT Acute Rehabilitation Office [phone]   Zenaida Niece 06/12/2021, 4:48 PM

## 2021-06-12 NOTE — Progress Notes (Signed)
NAME:  Felicia Davidson, MRN:  [id number], DOB:  [birth year], LOS: 3 ADMISSION DATE:  06/09/2021, CONSULTATION DATE:  5/31 REFERRING MD:  Hyacinth Meeker, CHIEF COMPLAINT:  Dyspnea   History of Present Illness:  76 y/o female with multiple medical problems presented with dyspnea, found to have complete heart block and multi-organ failure.    Pertinent  Medical History  Hypertension DM2 Neuropathy Anemia COVID Jan 2023 Former Cigarette smoker  Significant Hospital Events: Including procedures, antibiotic start and stop dates in addition to other pertinent events   5/31 admit with CHB, AKI, acute HF, hyperkalemia> temp pacer; started on dobutamine 6/1 temp pacemaker in place but occasionally does not capture with HR as low as 40s; Remains on dobutamine. Alert/oriented, HR in 60's in ICU on dobutamine 6/2 permanent pacemaker placed  Interim History / Subjective:  Feels better Hasn't been out of bed Had temp pacer yesterday  Objective   Blood pressure (!) 134/55, pulse 79, temperature 98.4 F (36.9 C), temperature source Oral, resp. rate 16, height 5\' 3"  (1.6 m), weight 91.9 kg, SpO2 96 %.        Intake/Output Summary (Last 24 hours) at 06/12/2021 08/12/2021 Last data filed at 06/12/2021 0600 Gross per 24 hour  Intake 791.37 ml  Output 1600 ml  Net -808.63 ml   Filed Weights   06/09/21 1003 06/12/21 0600  Weight: 90.3 kg 91.9 kg    Examination:  General:  Resting comfortably in chair HENT: NCAT OP clear PULM: CTA B, normal effort CV: RRR, no mgr GI: BS+, soft, nontender MSK: normal bulk and tone Neuro: awake, alert, no distress, MAEW  Resolved Hospital Problem list     Assessment & Plan:  Complete heart block > s/p permanent pacemaker Cardiogenic shock> improved Tele monitoring PPM management per cardiology  Acute pulmonary edema > resolved Monitor volume status  AKI > improving Hyperkalemia Hyponatremia Hypomagnesemia Monitor BMET and UOP Replace electrolytes as  needed  Hypertension Hyperlipidemia Crestor to continue  DM2 SSI  Physical deconditioning complicated by multi-organ failure and pacemaker placement/need for sling PT assessment today   Best Practice (right click and "Reselect all SmartList Selections" daily)   Diet/type: Regular consistency (see orders) DVT prophylaxis: SCD GI prophylaxis: N/A Lines: N/A Foley:  N/A Code Status:  full code Last date of multidisciplinary goals of care discussion [admission]  Labs   CBC: Recent Labs  Lab 06/09/21 1004 06/09/21 1228 06/10/21 0100 06/11/21 0038 06/12/21 0023  WBC 13.8* 14.8* 10.9* 9.4 10.6*  NEUTROABS 11.1*  --   --   --   --   HGB 9.2* 9.3* 8.6* 8.7* 9.4*  HCT 29.3* 29.1* 25.7* 26.9* 29.0*  MCV 95.1 93.3 90.2 92.1 92.1  PLT 218 229 214 215 248    Basic Metabolic Panel: Recent Labs  Lab 06/09/21 1228 06/09/21 1419 06/10/21 0103 06/11/21 0038 06/12/21 0023  NA 130* 130* 134* 136 138  K 5.8* 5.0 4.6 4.5 4.2  CL 101 101 104 106 108  CO2 18* 19* 19* 22 23  GLUCOSE 141* 135* 108* 97 134*  BUN 67* 68* 63* 54* 45*  CREATININE 2.60* 2.66* 2.51* 2.11* 1.70*  CALCIUM 9.8 8.8* 8.6* 8.2* 8.5*  MG 1.8  --  1.8 2.2  --    GFR: Estimated Creatinine Clearance: 30.8 mL/min (A) (by C-G formula based on SCr of 1.7 mg/dL (H)). Recent Labs  Lab 06/09/21 1228 06/10/21 0100 06/10/21 0103 06/11/21 0038 06/12/21 0023  PROCALCITON 0.69  --  0.75  --   --  WBC 14.8* 10.9*  --  9.4 10.6*    Liver Function Tests: Recent Labs  Lab 06/09/21 1004 06/12/21 0023  AST 14* 10*  ALT 11 10  ALKPHOS 89 81  BILITOT 0.8 0.5  PROT 6.4* 5.9*  ALBUMIN 3.3* 2.6*   No results for input(s): LIPASE, AMYLASE in the last 168 hours. No results for input(s): AMMONIA in the last 168 hours.  ABG    Component Value Date/Time   PHART 7.375 11/19/2020 0733   PCO2ART 43.0 11/19/2020 0733   PO2ART 104 11/19/2020 0733   HCO3 24.5 11/19/2020 0733   ACIDBASEDEF 0.2 11/19/2020 0733    O2SAT 98.2 11/19/2020 0733     Coagulation Profile: No results for input(s): INR, PROTIME in the last 168 hours.  Cardiac Enzymes: No results for input(s): CKTOTAL, CKMB, CKMBINDEX, TROPONINI in the last 168 hours.  HbA1C: Hgb A1c MFr Bld  Date/Time Value Ref Range Status  06/09/2021 12:25 PM 5.9 (H) 4.8 - 5.6 % Final    Comment:    (NOTE) Pre diabetes:          5.7%-6.4%  Diabetes:              >6.4%  Glycemic control for   <7.0% adults with diabetes   11/18/2020 02:30 PM 5.8 (H) 4.8 - 5.6 % Final    Comment:    (NOTE) Pre diabetes:          5.7%-6.4%  Diabetes:              >6.4%  Glycemic control for   <7.0% adults with diabetes     CBG: Recent Labs  Lab 06/11/21 1742 06/11/21 1940 06/11/21 2330 06/12/21 0429 06/12/21 0635  GLUCAP 139* 280* 154* 117* 116*    Critical care time: n/a    .dbmis

## 2021-06-13 DIAGNOSIS — I442 Atrioventricular block, complete: Secondary | ICD-10-CM | POA: Diagnosis not present

## 2021-06-13 LAB — COMPREHENSIVE METABOLIC PANEL
ALT: 9 U/L (ref 0–44)
AST: 14 U/L — ABNORMAL LOW (ref 15–41)
Albumin: 2.7 g/dL — ABNORMAL LOW (ref 3.5–5.0)
Alkaline Phosphatase: 82 U/L (ref 38–126)
Anion gap: 6 (ref 5–15)
BUN: 38 mg/dL — ABNORMAL HIGH (ref 8–23)
CO2: 22 mmol/L (ref 22–32)
Calcium: 8.6 mg/dL — ABNORMAL LOW (ref 8.9–10.3)
Chloride: 108 mmol/L (ref 98–111)
Creatinine, Ser: 1.49 mg/dL — ABNORMAL HIGH (ref 0.44–1.00)
GFR, Estimated: 36 mL/min — ABNORMAL LOW (ref >60)
Glucose, Bld: 131 mg/dL — ABNORMAL HIGH (ref 70–99)
Potassium: 4.2 mmol/L (ref 3.5–5.1)
Sodium: 136 mmol/L (ref 135–145)
Total Bilirubin: 0.5 mg/dL (ref 0.3–1.2)
Total Protein: 5.8 g/dL — ABNORMAL LOW (ref 6.5–8.1)

## 2021-06-13 LAB — GLUCOSE, CAPILLARY
Glucose-Capillary: 124 mg/dL — ABNORMAL HIGH (ref 70–99)
Glucose-Capillary: 172 mg/dL — ABNORMAL HIGH (ref 70–99)

## 2021-06-13 MED ORDER — LIDOCAINE HCL 1 % IJ SOLN
INTRAMUSCULAR | Status: AC
  Filled 2021-06-13: qty 60

## 2021-06-13 MED ORDER — HEPARIN (PORCINE) IN NACL 1000-0.9 UT/500ML-% IV SOLN
INTRAVENOUS | Status: AC
  Filled 2021-06-13: qty 500

## 2021-06-13 MED ORDER — MIDAZOLAM HCL 5 MG/5ML IJ SOLN
INTRAMUSCULAR | Status: AC
  Filled 2021-06-13: qty 5

## 2021-06-13 MED ORDER — FENTANYL CITRATE (PF) 100 MCG/2ML IJ SOLN
INTRAMUSCULAR | Status: AC
  Filled 2021-06-13: qty 2

## 2021-06-13 MED ORDER — CARVEDILOL 6.25 MG PO TABS: 6.2500 mg | ORAL_TABLET | Freq: Two times a day (BID) | ORAL | 11 refills | Status: DC

## 2021-06-13 NOTE — Discharge Summary (Signed)
Physician Discharge Summary         Patient ID: Felicia Davidson MRN: [id number] DOB/AGE: 76-Jan-1947 57 y.o.  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  She was seen by her PCP yesterday for same, noted HR in 40-50's and they were unable to obtain blood work.  She was supposed to return today for repeat attempt at blood work but given her progressive symptoms, patient called EMS. Denies any recent chest pain, fevers, or cough/ sputum production.   Was found to be in complete heart block with EMS. She takes norvasc, coreg, lasix, and olmesartan at home.  In ER, continues to be in complete heart block with ventricular rate in the 30's, however currently maintaining blood pressure, 167/46 with saturations 93% on room air, afebrile and noted to have 3+ pitting lower extremity edema.   Workup significant for Na 130, K 6, bicarb 17, BUN 67, sCr 2.65, BNP 549, trop hs 16, WBC 13.8, Hgb 9.2, CXR consistent with mild pulmonary edema.  She was treated with lasix, bicarb, calcium gluconate, and lokelmia in ER.  Dr. Rosemary Davidson consulted with cardiology with plans for temporary pacemaker. Heart block flet secondary to medication affect and degenerative conduction disease.    On 6/1 temporary pacemaker was placed per EP with intermittent capture resulting in exchange of temp pacer to establish better capture. EP and cardiology awaited resolution of AKI and anemia prior to  placement of permeant pacemaker. By 6/2 IV dobutamine was weaned off and permanent pacemaker was placed. By 6/4 patient appears near baseline and tolerating PPM well and her AKI had resolved. Therefore she was deemed stable for discharge. 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 Creatine peaked at 2.66 with GFR 18. By discharge creatinine improved to 1.49 with GFR 36  Hyperkalemia > resolved  Hyponatremia > resolved  Hypomagnesemia >resolved  P: Patient is encouraged to follow up with PCP In 1 week for repeat lab work including Bmet    Hypertension Hyperlipidemia Crestor to continue Reduce dose of Coreg to 6.25 BID per cardiology  Continue Norvasc at discharge    DM2 SSI   Physical deconditioning complicated by multi-organ failure and pacemaker placement/need for sling PT assessment today  Significant Hospital tests/ studies    Procedures   Permanent pacemaker placed 6/2   Culture data/antimicrobials      Consults  EP  Cardiology  PCCM     Discharge Exam: BP (!) 144/57 (BP Location: Right Arm)   Pulse 88   Temp 97.6 F (36.4 C) (Oral)   Resp 18   Ht 5\' 3"  (1.6 m)   Wt 92 kg Comment: scale C  SpO2 94%   BMI 35.92 kg/m  Physical Exam General: Well appearing elderly female sitting up  in bed in NAD HEENT: Felicia Davidson, MM pink/moist, PERRL,  Neuro: Alert and oriented x3, non-focal  CV: s1s2 regular rate and rhythm, no murmur, rubs, or gallops,  PULM:  No increased work of breathing, no added breath sounds, on RA GI: soft, bowel sounds active in all 4 quadrants, non-tender, non-distended,  tolerating oral diet Extremities: warm/dry, no edema  Skin: no rashes or lesions  Labs at discharge   Lab Results  Component Value Date   CREATININE 1.49 (H) 06/13/2021   BUN 38 (H) 06/13/2021   NA 136 06/13/2021   K 4.2 06/13/2021   CL 108 06/13/2021   CO2 22 06/13/2021   Lab Results  Component Value Date   WBC 10.6 (H) 06/12/2021   HGB 9.4 (L) 06/12/2021   HCT 29.0 (L) 06/12/2021   MCV 92.1 06/12/2021   PLT 248 06/12/2021   Lab Results  Component Value Date   ALT 9 06/13/2021   AST 14 (L) 06/13/2021   ALKPHOS 82 06/13/2021   BILITOT 0.5 06/13/2021   No results found for: INR, PROTIME  Current radiological studies    DG Chest 2 View  Result Date: 06/12/2021 CLINICAL DATA:  Follow-up post pacemaker insertion. EXAM: CHEST - 2 VIEW COMPARISON:  06/11/2021 at 5:31 a.m. FINDINGS: 5:05 a.m. 06/12/2021.  There are multiple overlying monitor wires. There is interval new demonstration of a left chest dual lead pacing system with 1 wire terminating in the region of the right atrium and the other in the right ventricle but appears to be in the more proximal rather than the distal ventricle. Heart size and vascular pattern are normal. The mediastinum is normally configured. No pleural effusion is seen. The lungs are clear. Osteopenia and thoracic spondylosis. IMPRESSION: Left chest dual lead pacemaker. One of the wires terminates in the right atrium, the other in the right ventricle but appears to be in the proximal to mid ventricle rather than the usual location of the distal ventricle. No pneumothorax or other acute chest process. Interval removal right IJ pacing wire. Electronically Signed   By: Almira Bar M.D.   On: 06/12/2021 05:47   EP PPM/ICD IMPLANT  Result Date: 06/11/2021 SURGEON:  Felicia Brooklyn, MD   PREPROCEDURE DIAGNOSIS:  complete AV block   POSTPROCEDURE DIAGNOSIS:  complete AV block    PROCEDURES:  1. Pacemaker implantation.   INTRODUCTION:  Felicia Davidson is a 76 y.o.  female with a history of bradycardia who presents today for pacemaker implantation.  The patient reports intermittent episodes of dizziness over the past few months.  No reversible causes have been identified.  The patient therefore presents today for pacemaker implantation.   DESCRIPTION OF PROCEDURE:  Informed written consent was obtained, and  the patient was brought to the electrophysiology lab in a fasting state.  The patient required no sedation for the procedure today.  The patients left chest was prepped and draped in the usual sterile fashion by the EP lab staff. The skin overlying the left deltopectoral region was infiltrated with lidocaine for local analgesia.  A 4-cm incision was made over the left deltopectoral region.  A left subcutaneous pacemaker pocket was fashioned using a combination of sharp and blunt dissection. Electrocautery was required to assure hemostasis.    RA/RV Lead Placement: The left axillary vein was therefore cannulated.  Through the left axillary vein, a [employer] Medical model Tendril MRI [id number] (serial number  [id number]) right atrial lead and an  [employer] Medical model Tendril MRI [phone] (serial number  [id number]) right ventricular lead were advanced with fluoroscopic visualization into the right atrial appendage and left bundle positions respectively.  Initial atrial lead P- waves measured 2.8 mV with impedance of 509 ohms and a threshold of 1.3 V at 0.5 msec.  Right ventricular lead R-waves measured 12.5 mV paced with an impedance of 749 ohms and a threshold of 1.3 V at 0.5 msec.  Both leads were secured to the pectoralis fascia using #2-0 silk over the suture sleeves. Device Placement:  The leads were then connected to an Abbott Medical Assurity MRI  model [id number] (serial number  [id number] ) pacemaker.  The pocket was irrigated with copious gentamicin solution.  The pacemaker was then placed into the pocket.  The pocket was then closed in 3 layers with 2.0 Vicryl suture for the 3.0  Vicryl suture subcutaneous and subcuticular layers.  Steri-  Strips and a sterile dressing were then applied. EBL<42ml.  There were no early apparent complications.   CONCLUSIONS:  1. Successful implantation of a [employer] Medical Assurity MRI dual-chamber pacemaker for symptomatic bradycardia  2. No early apparent complications.       Will Elberta Fortis, MD 06/11/2021 4:35 PM    Disposition:        Allergies as of 06/13/2021       Reactions   Lipitor [atorvastatin] Other (See Comments)   Sore muscles   Penicillins Other (See Comments)   Stiff joints        Medication List     STOP taking these medications    CALCIUM 600 PO   furosemide 20 MG tablet Commonly known as: Lasix   olmesartan 40 MG tablet Commonly known as: BENICAR       TAKE these medications    amLODipine 10 MG tablet Commonly known as: NORVASC Take 10 mg by mouth daily.   carvedilol 6.25 MG tablet Commonly known as: Coreg Take 1 tablet (6.25 mg total) by mouth 2 (two) times daily. What changed:  medication strength how much to take   ferrous sulfate 325 (65 FE) MG tablet Take 325 mg by mouth daily with breakfast.   glipiZIDE 10 MG tablet Commonly known as: GLUCOTROL Take 5 mg by mouth 2 (two) times daily before a meal.   metFORMIN 1000 MG tablet Commonly known as: GLUCOPHAGE Take 1,000 mg by mouth 2 (two) times daily with a meal.   rosuvastatin 10 MG tablet Commonly known as: CRESTOR Take 10 mg by mouth daily.   vitamin B-12 500 MCG tablet Commonly known as: CYANOCOBALAMIN Take 500 mcg by mouth daily.         Follow-up appointment   PCP Cardiology  Discharge Condition:    stable   Signed: Mckinzey Entwistle D. Harris 06/13/2021, 10:09 AM

## 2021-06-14 ENCOUNTER — Encounter (HOSPITAL_COMMUNITY): Payer: Self-pay | Admitting: Cardiology

## 2021-06-14 LAB — CULTURE, BLOOD (ROUTINE X 2)
Culture: NO GROWTH
Culture: NO GROWTH
Special Requests: ADEQUATE

## 2021-06-24 ENCOUNTER — Ambulatory Visit (INDEPENDENT_AMBULATORY_CARE_PROVIDER_SITE_OTHER): Payer: Medicare Other

## 2021-06-24 DIAGNOSIS — I442 Atrioventricular block, complete: Secondary | ICD-10-CM

## 2021-06-24 LAB — CUP PACEART INCLINIC DEVICE CHECK
Battery Remaining Longevity: 127 mo
Battery Voltage: 3.08 V
Brady Statistic RA Percent Paced: 1 %
Brady Statistic RV Percent Paced: 99.98 %
Date Time Interrogation Session: 20230615104400
Implantable Lead Implant Date: 20230602
Implantable Lead Implant Date: 20230602
Implantable Lead Location: 753859
Implantable Lead Location: 753860
Implantable Pulse Generator Implant Date: 20230602
Lead Channel Impedance Value: 425 Ohm
Lead Channel Impedance Value: 475 Ohm
Lead Channel Pacing Threshold Amplitude: 0.5 V
Lead Channel Pacing Threshold Amplitude: 0.5 V
Lead Channel Pacing Threshold Amplitude: 0.5 V
Lead Channel Pacing Threshold Amplitude: 0.5 V
Lead Channel Pacing Threshold Pulse Width: 0.5 ms
Lead Channel Pacing Threshold Pulse Width: 0.5 ms
Lead Channel Pacing Threshold Pulse Width: 0.5 ms
Lead Channel Pacing Threshold Pulse Width: 0.5 ms
Lead Channel Sensing Intrinsic Amplitude: 3 mV
Lead Channel Setting Pacing Amplitude: 0.875
Lead Channel Setting Pacing Amplitude: 3.5 V
Lead Channel Setting Pacing Pulse Width: 0.5 ms
Lead Channel Setting Sensing Sensitivity: 4 mV
Pulse Gen Model: 2272
Pulse Gen Serial Number: 8081700

## 2021-06-24 NOTE — Progress Notes (Signed)
Wound check appointment. Steri-strips removed. Wound without redness or edema. Incision edges approximated, wound well healed. Normal device function. Thresholds, sensing, and impedances consistent with implant measurements. Device programmed at 3.5V/auto capture programmed on for extra safety margin until 3 month visit. Histogram distribution appropriate for patient and level of activity. 1  AMS <10seconds in duration, No high ventricular rates noted. Patient educated about wound care, arm mobility, lifting restrictions. ROV 09/17/2021 w/ WC

## 2021-06-24 NOTE — Patient Instructions (Addendum)
   After Your Pacemaker   Monitor your pacemaker site for redness, swelling, and drainage. Call the device clinic at [phone] if you experience these symptoms or fever/chills.  Your incision was closed with Steri-strips or staples:  You may shower 7 days after your procedure and wash your incision with soap and water. Avoid lotions, ointments, or perfumes over your incision until it is well-healed.  You may use a hot tub or a pool after your wound check appointment if the incision is completely closed.  Do not lift, push or pull greater than 10 pounds with the affected arm until 07/23/2021. There are no other restrictions in arm movement after your wound check appointment.  You may drive, unless driving has been restricted by your healthcare providers.    Remote monitoring is used to monitor your pacemaker from home. This monitoring is scheduled every 91 days by our office. It allows [place] to keep an eye on the functioning of your device to ensure it is working properly. You will routinely see your Electrophysiologist annually (more often if necessary).

## 2021-06-25 ENCOUNTER — Ambulatory Visit: Payer: Medicare Other | Admitting: Cardiology

## 2021-06-25 ENCOUNTER — Encounter: Payer: Self-pay | Admitting: Cardiology

## 2021-06-25 VITALS — BP 178/71 | HR 87 | Temp 97.7°F | Resp 17 | Ht 63.0 in | Wt 196.0 lb

## 2021-06-25 DIAGNOSIS — I442 Atrioventricular block, complete: Secondary | ICD-10-CM

## 2021-06-25 DIAGNOSIS — I10 Essential (primary) hypertension: Secondary | ICD-10-CM

## 2021-06-25 MED ORDER — CARVEDILOL 12.5 MG PO TABS: 6.2500 mg | ORAL_TABLET | Freq: Two times a day (BID) | ORAL | 0 refills | Status: DC

## 2021-06-25 MED ORDER — FUROSEMIDE 40 MG PO TABS: 40.0000 mg | ORAL_TABLET | Freq: Every day | ORAL | 1 refills | Status: DC

## 2021-06-25 NOTE — Progress Notes (Signed)
Patient referred by Kaleen Mask, * for complete AV block  Subjective:   Felicia Davidson, female    DOB: 17-Jun-1945, 76 y.o.   MRN: [id number]   Chief Complaint  Patient presents with   AV block     HPI  76 y.o. Caucasian female with hypertension, type 2 DM, CKD, neuropathy, anemia, complete AV block, now s/p PPM (06/2021)  Patient was admitted in June 2023 with worsening shortness of breath, negative, was found to be in complete AV block.  She also had AKI.  AKI and other symptoms improved with permanent pacemaker placement, following temporary pacemaker placement.  She is feeling cheerful.  Blood pressure remains elevated.  Leg edema persists.   Past Medical History:  Diagnosis Date   Anemia    HLD (hyperlipidemia)    HTN (hypertension)    Neuropathy      Past Surgical History:  Procedure Laterality Date   COMBINED HYSTEROSCOPY DIAGNOSTIC / D&C     PACEMAKER IMPLANT N/A 06/11/2021   Procedure: PACEMAKER IMPLANT;  Surgeon: Regan Lemming, MD;  Location: [facility];  Service: Cardiovascular;  Laterality: N/A;   TEMPORARY PACEMAKER N/A 06/09/2021   Procedure: TEMPORARY PACEMAKER;  Surgeon: Elder Negus, MD;  Location: [facility];  Service: Cardiovascular;  Laterality: N/A;   TEMPORARY PACEMAKER N/A 06/10/2021   Procedure: TEMPORARY PACEMAKER;  Surgeon: Elder Negus, MD;  Location: [facility];  Service: Cardiovascular;  Laterality: N/A;     Social History   Tobacco Use  Smoking Status Never  Smokeless Tobacco Never    Social History   Substance and Sexual Activity  Alcohol Use No   Alcohol/week: 0.0 standard drinks of alcohol     Family History  Problem Relation Age of Onset   Anemia Mother    Pneumonia Mother    Heart disease Father    CAD Father    COPD Sister    Lung disease Maternal Grandfather       Current Outpatient Medications:    amLODipine (NORVASC) 10 MG tablet, Take 10 mg by mouth  daily., Disp: , Rfl:    carvedilol (COREG) 6.25 MG tablet, Take 1 tablet (6.25 mg total) by mouth 2 (two) times daily., Disp: 60 tablet, Rfl: 11   ferrous sulfate 325 (65 FE) MG tablet, Take 325 mg by mouth daily with breakfast. , Disp: , Rfl:    glipiZIDE (GLUCOTROL) 10 MG tablet, Take 5 mg by mouth 2 (two) times daily before a meal., Disp: , Rfl: 1   metFORMIN (GLUCOPHAGE) 1000 MG tablet, Take 1,000 mg by mouth 2 (two) times daily with a meal., Disp: , Rfl:    rosuvastatin (CRESTOR) 10 MG tablet, Take 10 mg by mouth daily. , Disp: , Rfl: 1   vitamin B-12 (CYANOCOBALAMIN) 500 MCG tablet, Take 500 mcg by mouth daily., Disp: , Rfl:    Cardiovascular and other pertinent studies:  Reviewed external labs and tests, independently interpreted   PPM placement 06/11/2021: Abbott, by Dr. Lalla Brothers  Echocardiogram 06/09/2021: 1. Left ventricular ejection fraction, by estimation, is 60 to 65%. The  left ventricle has normal function. The left ventricle has no regional  wall motion abnormalities. There is mild left ventricular hypertrophy.  Left ventricular diastolic function not   assessed due to complete AV blocl.   2. Right ventricular systolic function is normal. The right ventricular  size is normal.   3. The mitral valve is normal in structure. Mild mitral valve  regurgitation. No evidence of mitral stenosis.   4. The aortic valve is normal in structure. Aortic valve regurgitation is  mild. No aortic stenosis is present.   EKG 06/25/2021: Sinus rhythm 92 bpm First degree AV block LBBB      Recent labs: 06/13/2021: Glucose 131, BUN/Cr 38/1.49. EGFR 36. Na/K 136/4.2. Albumin 2.7. Rest of the CMP normal H/H 9.4/29. MCV 92. Platelets 248 HbA1C 5.9%    Review of Systems  Cardiovascular:  Negative for chest pain, dyspnea on exertion, leg swelling, palpitations and syncope.        Vitals:   06/25/21 0827 06/25/21 0834  BP: (!) 162/68 (!) 123/45  Pulse: 92 92  Resp: 17   Temp:  97.7 F (36.5 C)   SpO2: 97% 97%     Body mass index is 34.72 kg/m. Filed Weights   06/25/21 0827  Weight: 196 lb (88.9 kg)     Objective:   Physical Exam Vitals and nursing note reviewed.  Constitutional:      General: She is not in acute distress. Neck:     Vascular: No JVD.  Cardiovascular:     Rate and Rhythm: Normal rate and regular rhythm.     Heart sounds: Normal heart sounds. No murmur heard. Pulmonary:     Effort: Pulmonary effort is normal.     Breath sounds: Normal breath sounds. No wheezing or rales.  Musculoskeletal:     Right lower leg: Edema (2+) present.     Left lower leg: Edema (2+) present.         Visit diagnoses:   ICD-10-CM   1. Complete heart block (HCC)  I44.2 EKG 12-Lead    2. Primary hypertension  I10        Orders Placed This Encounter  Procedures   EKG 12-Lead     Medication changes this visit: Medications Discontinued During This Encounter  Medication Reason   carvedilol (COREG) 6.25 MG tablet Reorder    Meds ordered this encounter  Medications   carvedilol (COREG) 12.5 MG tablet    Sig: Take 0.5 tablets (6.25 mg total) by mouth 2 (two) times daily.    Dispense:  1 tablet    Refill:  0   furosemide (LASIX) 40 MG tablet    Sig: Take 1 tablet (40 mg total) by mouth daily.    Dispense:  30 tablet    Refill:  1     Assessment & Recommendations:    76 y.o. 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  Not requiring pacing today.  Has follow-up with [facility] EP clinic.   Subsequently, monitoring, transferred with our office.  Hypertension: Remains uncontrolled.  Initiate placement, increase carvedilol back to 12.5 mg twice daily. Also added Lasix 40 mg daily, given her negative mom. After adequate diuresis, losartan can be removed by her PCP Dr. Arelia Sneddon with whom she has follow-up hopefully in the coming week or  2.  F/u w/me in 3 months   Thank you for referring the patient to [place]. Please feel free to contact with any questions.   Nigel Mormon, MD Pager: [phone] Office: [phone]

## 2021-09-16 ENCOUNTER — Ambulatory Visit (INDEPENDENT_AMBULATORY_CARE_PROVIDER_SITE_OTHER): Payer: Medicare Other

## 2021-09-16 DIAGNOSIS — I442 Atrioventricular block, complete: Secondary | ICD-10-CM

## 2021-09-16 LAB — CUP PACEART REMOTE DEVICE CHECK
Battery Remaining Longevity: 104 mo
Battery Remaining Percentage: 95.5 %
Battery Voltage: 3.02 V
Brady Statistic AP VP Percent: 1 %
Brady Statistic AP VS Percent: 1 %
Brady Statistic AS VP Percent: 99 %
Brady Statistic AS VS Percent: 1 %
Brady Statistic RA Percent Paced: 1 %
Brady Statistic RV Percent Paced: 99 %
Date Time Interrogation Session: 20230907020033
Implantable Lead Implant Date: 20230602
Implantable Lead Implant Date: 20230602
Implantable Lead Location: 753859
Implantable Lead Location: 753860
Implantable Pulse Generator Implant Date: 20230602
Lead Channel Impedance Value: 430 Ohm
Lead Channel Impedance Value: 450 Ohm
Lead Channel Pacing Threshold Amplitude: 0.5 V
Lead Channel Pacing Threshold Amplitude: 0.5 V
Lead Channel Pacing Threshold Pulse Width: 0.5 ms
Lead Channel Pacing Threshold Pulse Width: 0.5 ms
Lead Channel Sensing Intrinsic Amplitude: 3 mV
Lead Channel Setting Pacing Amplitude: 0.75 V
Lead Channel Setting Pacing Amplitude: 3.5 V
Lead Channel Setting Pacing Pulse Width: 0.5 ms
Lead Channel Setting Sensing Sensitivity: 4 mV
Pulse Gen Model: 2272
Pulse Gen Serial Number: 8081700

## 2021-09-17 ENCOUNTER — Ambulatory Visit: Payer: Medicare Other | Attending: Cardiology | Admitting: Cardiology

## 2021-09-17 ENCOUNTER — Encounter: Payer: Self-pay | Admitting: Cardiology

## 2021-09-17 VITALS — BP 142/68 | HR 97 | Ht 63.0 in | Wt 184.8 lb

## 2021-09-17 DIAGNOSIS — I442 Atrioventricular block, complete: Secondary | ICD-10-CM

## 2021-09-17 DIAGNOSIS — I10 Essential (primary) hypertension: Secondary | ICD-10-CM

## 2021-09-17 LAB — CUP PACEART INCLINIC DEVICE CHECK
Date Time Interrogation Session: 20230908161911
Implantable Lead Implant Date: 20230602
Implantable Lead Implant Date: 20230602
Implantable Lead Location: 753859
Implantable Lead Location: 753860
Implantable Pulse Generator Implant Date: 20230602
Pulse Gen Model: 2272
Pulse Gen Serial Number: 8081700

## 2021-09-17 NOTE — Progress Notes (Signed)
Electrophysiology Office Note   Date:  09/17/2021   ID:  Felicia Davidson, DOB 01-27-1945, MRN [id number]  PCP:  Kaleen Mask, MD  Cardiologist:  Rosemary Holms Primary Electrophysiologist:  Marissa Weaver Jorja Loa, MD    Chief Complaint: Complete heart block   History of Present Illness: Felicia Davidson is a 76 y.o. female who is being seen today for the evaluation of complete heart block at the request of Kaleen Mask, *. Presenting today for electrophysiology evaluation.  She has a history significant for hypertension, hyperlipidemia.  She presented to the hospital June 2023 with complete heart block.  She was having fatigue and dyspnea on exertion.  She is now status post Abbott dual-chamber pacemaker implanted 06/11/2021.  Today, she denies symptoms of palpitations, chest pain, shortness of breath, orthopnea, PND, lower extremity edema, claudication, dizziness, presyncope, syncope, bleeding, or neurologic sequela. The patient is tolerating medications without difficulties.    Past Medical History:  Diagnosis Date   Anemia    HLD (hyperlipidemia)    HTN (hypertension)    Neuropathy    Past Surgical History:  Procedure Laterality Date   COMBINED HYSTEROSCOPY DIAGNOSTIC / D&C     PACEMAKER IMPLANT N/A 06/11/2021   Procedure: PACEMAKER IMPLANT;  Surgeon: Regan Lemming, MD;  Location: [facility];  Service: Cardiovascular;  Laterality: N/A;   TEMPORARY PACEMAKER N/A 06/09/2021   Procedure: TEMPORARY PACEMAKER;  Surgeon: Elder Negus, MD;  Location: [facility];  Service: Cardiovascular;  Laterality: N/A;   TEMPORARY PACEMAKER N/A 06/10/2021   Procedure: TEMPORARY PACEMAKER;  Surgeon: Elder Negus, MD;  Location: [facility];  Service: Cardiovascular;  Laterality: N/A;     Current Outpatient Medications  Medication Sig Dispense Refill   amLODipine (NORVASC) 10 MG tablet Take 10 mg by mouth daily.     carvedilol (COREG) 12.5 MG  tablet Take 0.5 tablets (6.25 mg total) by mouth 2 (two) times daily. 1 tablet 0   ferrous sulfate 325 (65 FE) MG tablet Take 325 mg by mouth daily with breakfast.      glipiZIDE (GLUCOTROL) 10 MG tablet Take 5 mg by mouth 2 (two) times daily before a meal.  1   metFORMIN (GLUCOPHAGE) 1000 MG tablet Take 1,000 mg by mouth 2 (two) times daily with a meal.     rosuvastatin (CRESTOR) 10 MG tablet Take 10 mg by mouth daily.   1   vitamin B-12 (CYANOCOBALAMIN) 500 MCG tablet Take 500 mcg by mouth daily.     furosemide (LASIX) 40 MG tablet Take 1 tablet (40 mg total) by mouth daily. (Patient not taking: Reported on 09/17/2021) 30 tablet 1   No current facility-administered medications for this visit.    Allergies:   Lipitor [atorvastatin] and Penicillins   Social History:  The patient  reports that she has never smoked. She has never used smokeless tobacco. She reports that she does not drink alcohol and does not use drugs.   Family History:  The patient's family history includes Anemia in her mother; CAD in her father; COPD in her sister; Heart disease in her father; Lung disease in her maternal grandfather; Pneumonia in her mother.    ROS:  Please see the history of present illness.   Otherwise, review of systems is positive for none.   All other systems are reviewed and negative.    PHYSICAL EXAM: VS:  BP (!) 142/68   Pulse 97   Ht 5\' 3"  (1.6 m)  Wt 184 lb 12.8 oz (83.8 kg)   SpO2 97%   BMI 32.74 kg/m  , BMI Body mass index is 32.74 kg/m. GEN: Well nourished, well developed, in no acute distress  HEENT: normal  Neck: no JVD, carotid bruits, or masses Cardiac: RRR; no murmurs, rubs, or gallops,no edema  Respiratory:  clear to auscultation bilaterally, normal work of breathing GI: soft, nontender, nondistended, + BS MS: no deformity or atrophy  Skin: warm and dry, device pocket is well healed Neuro:  Strength and sensation are intact Psych: euthymic mood, full affect  EKG:  EKG is  ordered today. Personal review of the ekg ordered shows sinus rhythm, V pacing, rate 97  Device interrogation is reviewed today in detail.  See PaceArt for details.   Recent Labs: 06/09/2021: B Natriuretic Peptide 549.0 06/11/2021: Magnesium 2.2 06/12/2021: Hemoglobin 9.4; Platelets 248 06/13/2021: ALT 9; BUN 38; Creatinine, Ser 1.49; Potassium 4.2; Sodium 136    Lipid Panel  No results found for: "CHOL", "TRIG", "HDL", "CHOLHDL", "VLDL", "LDLCALC", "LDLDIRECT"   Wt Readings from Last 3 Encounters:  09/17/21 184 lb 12.8 oz (83.8 kg)  06/25/21 196 lb (88.9 kg)  06/13/21 202 lb 12.8 oz (92 kg)      Other studies Reviewed: Additional studies/ records that were reviewed today include: TTE 06/09/21  Review of the above records today demonstrates:   1. Left ventricular ejection fraction, by estimation, is 60 to 65%. The  left ventricle has normal function. The left ventricle has no regional  wall motion abnormalities. There is mild left ventricular hypertrophy.  Left ventricular diastolic function not   assessed due to complete AV blocl.   2. Right ventricular systolic function is normal. The right ventricular  size is normal.   3. The mitral valve is normal in structure. Mild mitral valve  regurgitation. No evidence of mitral stenosis.   4. The aortic valve is normal in structure. Aortic valve regurgitation is  mild. No aortic stenosis is present.    ASSESSMENT AND PLAN:  1.  Complete heart block: Status post Abbott dual-chamber pacemaker implanted 06/11/2021.  Device functioning appropriately.  No changes at this time.  2.  Hypertension: Evaded today.  Usually well controlled.  No changes.  Current medicines are reviewed at length with the patient today.   The patient does not have concerns regarding her medicines.  The following changes were made today:  none  Labs/ tests ordered today include:  Orders Placed This Encounter  Procedures   EKG 12-Lead     Disposition:   FU 9  months  Signed, Rachel Rison Jorja Loa, MD  09/17/2021 3:41 PM     [facility] [address] [place] [address] [phone] (office) [phone] (fax)

## 2021-09-17 NOTE — Patient Instructions (Signed)
Medication Instructions:  Your physician recommends that you continue on your current medications as directed. Please refer to the Current Medication list given to you today.  *If you need a refill on your cardiac medications before your next appointment, please call your pharmacy*   Lab Work: None ordered   Testing/Procedures: None ordered   Follow-Up: At [facility], you and your health needs are our priority.  As part of our continuing mission to provide you with exceptional heart care, we have created designated Provider Care Teams.  These Care Teams include your primary Cardiologist (physician) and Advanced Practice Providers (APPs -  Physician Assistants and Nurse Practitioners) who all work together to provide you with the care you need, when you need it.  Remote monitoring is used to monitor your Pacemaker or ICD from home. This monitoring reduces the number of office visits required to check your device to one time per year. It allows [place] to keep an eye on the functioning of your device to ensure it is working properly. You are scheduled for a device check from home on 01/05/2022. You may send your transmission at any time that day. If you have a wireless device, the transmission will be sent automatically. After your physician reviews your transmission, you will receive a postcard with your next transmission date.  Your next appointment:   9 month(s)  The format for your next appointment:   In Person  Provider:   You will see one of the following Advanced Practice Providers on your designated Care Team:   Francis Dowse, [place] Casimiro Needle "Mardelle Matte" Lanna Poche, [place]      Thank you for choosing [facility]!!   Dory Horn, RN [phone]    Other Instructions  Important Information About Sugar

## 2021-09-24 ENCOUNTER — Encounter: Payer: Self-pay | Admitting: Cardiology

## 2021-09-24 ENCOUNTER — Other Ambulatory Visit: Payer: Self-pay

## 2021-09-24 ENCOUNTER — Ambulatory Visit: Payer: Medicare Other | Admitting: Cardiology

## 2021-09-24 VITALS — BP 168/64 | HR 92 | Temp 98.0°F | Resp 16 | Ht 63.0 in | Wt 183.0 lb

## 2021-09-24 DIAGNOSIS — I10 Essential (primary) hypertension: Secondary | ICD-10-CM

## 2021-09-24 DIAGNOSIS — I442 Atrioventricular block, complete: Secondary | ICD-10-CM

## 2021-09-24 MED ORDER — CARVEDILOL 12.5 MG PO TABS: 6.2500 mg | ORAL_TABLET | Freq: Two times a day (BID) | ORAL | 0 refills | Status: DC

## 2021-09-24 MED ORDER — CARVEDILOL 6.25 MG PO TABS: 6.2500 mg | ORAL_TABLET | Freq: Two times a day (BID) | ORAL | 2 refills | Status: DC

## 2021-09-24 MED ORDER — AMLODIPINE BESYLATE 5 MG PO TABS: 5.0000 mg | ORAL_TABLET | Freq: Every day | ORAL | 2 refills | Status: DC

## 2021-09-24 MED ORDER — LOSARTAN POTASSIUM 25 MG PO TABS: 25.0000 mg | ORAL_TABLET | Freq: Every day | ORAL | 1 refills | Status: DC

## 2021-09-24 MED ORDER — FUROSEMIDE 40 MG PO TABS: 40.0000 mg | ORAL_TABLET | Freq: Every day | ORAL | 1 refills | Status: DC

## 2021-09-24 NOTE — Progress Notes (Signed)
Updated meds for patient to not have to take 1/2 tabs of amlodipine and carvedilol.

## 2021-09-24 NOTE — Progress Notes (Signed)
Patient referred by Leonard Downing, * for complete AV block  Subjective:   Felicia Davidson, female    DOB: [birth year], 76 y.o.   MRN: [id number]   Chief Complaint  Patient presents with   Hypertension   Follow-up     HPI  76 y.o. 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 She denies chest pain, shortness of breath. BP elevated today, reportedly lower at home. There is a lot of lack of clarity which medications is the patient taking.    Current Outpatient Medications:    amLODipine (NORVASC) 10 MG tablet, Take 10 mg by mouth daily., Disp: , Rfl:    carvedilol (COREG) 12.5 MG tablet, Take 0.5 tablets (6.25 mg total) by mouth 2 (two) times daily., Disp: 1 tablet, Rfl: 0   ferrous sulfate 325 (65 FE) MG tablet, Take 325 mg by mouth daily with breakfast. , Disp: , Rfl:    furosemide (LASIX) 40 MG tablet, Take 1 tablet (40 mg total) by mouth daily. (Patient not taking: Reported on 09/17/2021), Disp: 30 tablet, Rfl: 1   glipiZIDE (GLUCOTROL) 10 MG tablet, Take 5 mg by mouth 2 (two) times daily before a meal., Disp: , Rfl: 1   metFORMIN (GLUCOPHAGE) 1000 MG tablet, Take 1,000 mg by mouth 2 (two) times daily with a meal., Disp: , Rfl:    rosuvastatin (CRESTOR) 10 MG tablet, Take 10 mg by mouth daily. , Disp: , Rfl: 1   vitamin B-12 (CYANOCOBALAMIN) 500 MCG tablet, Take 500 mcg by mouth daily., Disp: , Rfl:    Cardiovascular and other pertinent studies:  Reviewed external labs and tests, independently interpreted   PPM placement 06/11/2021: Abbott, by Dr. Quentin Ore  Echocardiogram 06/09/2021: 1. Left ventricular ejection fraction, by estimation, is 60 to 65%. The  left ventricle has normal function. The left ventricle has no regional  wall motion abnormalities. There is mild left ventricular hypertrophy.  Left ventricular diastolic function not   assessed due to complete AV blocl.   2.  Right ventricular systolic function is normal. The right ventricular  size is normal.   3. The mitral valve is normal in structure. Mild mitral valve  regurgitation. No evidence of mitral stenosis.   4. The aortic valve is normal in structure. Aortic valve regurgitation is  mild. No aortic stenosis is present.   EKG 06/25/2021: Sinus rhythm 92 bpm First degree AV block LBBB      Recent labs: 06/13/2021: Glucose 131, BUN/Cr 38/1.49. EGFR 36. Na/K 136/4.2. Albumin 2.7. Rest of the CMP normal H/H 9.4/29. MCV 92. Platelets 248 HbA1C 5.9%    Review of Systems  Cardiovascular:  Negative for chest pain, dyspnea on exertion, leg swelling, palpitations and syncope.        Vitals:   09/24/21 0915  BP: (!) 168/64  Pulse: 92  Resp: 16  Temp: 98 F (36.7 C)  SpO2: 96%     Body mass index is 32.42 kg/m. Filed Weights   09/24/21 0915  Weight: 183 lb (83 kg)     Objective:   Physical Exam Vitals and nursing note reviewed.  Constitutional:      General: She is not in acute distress. Neck:     Vascular: No JVD.  Cardiovascular:     Rate and Rhythm: Normal rate and regular rhythm.     Heart sounds: Normal heart sounds. No murmur heard. Pulmonary:  Effort: Pulmonary effort is normal.     Breath sounds: Normal breath sounds. No wheezing or rales.  Musculoskeletal:     Right lower leg: Edema (2+) present.     Left lower leg: Edema (2+) present.         Visit diagnoses:   ICD-10-CM   1. Complete AV block (HCC)  I44.2     2. Essential hypertension  I10 losartan (COZAAR) 25 MG tablet    furosemide (LASIX) 40 MG tablet    Basic metabolic panel    B Nat Peptide    DISCONTINUED: carvedilol (COREG) 12.5 MG tablet       Orders Placed This Encounter  Procedures   Basic metabolic panel   B Nat Peptide     Medication changes this visit: Medications Discontinued During This Encounter  Medication Reason   furosemide (LASIX) 40 MG tablet    carvedilol (COREG)  12.5 MG tablet     Meds ordered this encounter  Medications   DISCONTD: carvedilol (COREG) 12.5 MG tablet    Sig: Take 0.5 tablets (6.25 mg total) by mouth 2 (two) times daily.    Dispense:  1 tablet    Refill:  0   losartan (COZAAR) 25 MG tablet    Sig: Take 1 tablet (25 mg total) by mouth daily.    Dispense:  30 tablet    Refill:  1   furosemide (LASIX) 40 MG tablet    Sig: Take 1 tablet (40 mg total) by mouth daily.    Dispense:  30 tablet    Refill:  1     Assessment & Recommendations:    76 y.o. 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    Hypertension: BP remain elevated. Not clear what medications she is actually taking.  I recommend the following changes.  Start losartan 25 mg daily, lasix 40 mg daily. Reduce amlodipine to 5 mg daily. (Suspect some of the leg edema could be due to amlodipine). Increase carvedilol from 0.526 mg daily to bid.   Arranged for remote patient monitoring and principal care management through pur pharmacist Haynes Dage.  F/u in 3 months    Nigel Mormon, MD Pager: [phone] Office: [phone]

## 2021-09-29 ENCOUNTER — Ambulatory Visit
Admission: RE | Admit: 2021-09-29 | Discharge: 2021-09-29 | Disposition: A | Discharge status: Home / Self Care | Payer: Medicare Other | Source: Ambulatory Visit | Attending: Family Medicine | Admitting: Family Medicine

## 2021-09-29 ENCOUNTER — Other Ambulatory Visit: Payer: Self-pay | Admitting: Family Medicine

## 2021-09-29 DIAGNOSIS — M545 Low back pain, unspecified: Secondary | ICD-10-CM

## 2021-09-30 ENCOUNTER — Ambulatory Visit: Payer: Medicare Other | Admitting: Cardiology

## 2021-09-30 VITALS — BP 174/79 | HR 94

## 2021-09-30 DIAGNOSIS — I10 Essential (primary) hypertension: Secondary | ICD-10-CM

## 2021-10-01 NOTE — Progress Notes (Signed)
Felicia Davidson is a 76yo WF who is here for a home medication reconciliation and has also brought her home BP readings with her.  Her current medication bottles reflect accurately what she is supposed to be taking.  Patient typically has high BP when in office. Upon review of her home BP reading logs, her BP is well controlled at home (SBP 120-140s). (Last two months of home recorded BP readings are in Media under "Home Monitoring Data".  Will not enroll patient in RPM with BP monitoring at this time. She has good control over her current process at home.    Current Outpatient Medications:    amLODipine (NORVASC) 5 MG tablet, Take 1 tablet (5 mg total) by mouth daily. Stop taking amlodipine 10 mg., Disp: 30 tablet, Rfl: 2   carvedilol (COREG) 6.25 MG tablet, Take 1 tablet (6.25 mg total) by mouth 2 (two) times daily., Disp: 60 tablet, Rfl: 2   ferrous sulfate 325 (65 FE) MG tablet, Take 325 mg by mouth daily with breakfast. , Disp: , Rfl:    furosemide (LASIX) 40 MG tablet, Take 1 tablet (40 mg total) by mouth daily., Disp: 30 tablet, Rfl: 1   glipiZIDE (GLUCOTROL) 10 MG tablet, Take 5 mg by mouth 2 (two) times daily before a meal., Disp: , Rfl: 1   losartan (COZAAR) 25 MG tablet, Take 1 tablet (25 mg total) by mouth daily., Disp: 30 tablet, Rfl: 1   metFORMIN (GLUCOPHAGE) 1000 MG tablet, Take 1,000 mg by mouth 2 (two) times daily with a meal., Disp: , Rfl:    rosuvastatin (CRESTOR) 10 MG tablet, Take 10 mg by mouth daily. , Disp: , Rfl: 1   vitamin B-12 (CYANOCOBALAMIN) 500 MCG tablet, Take 500 mcg by mouth daily., Disp: , Rfl:      Haynes Dage, [facility]

## 2021-10-02 NOTE — Progress Notes (Signed)
Remote pacemaker transmission.   

## 2021-11-13 ENCOUNTER — Other Ambulatory Visit: Payer: Self-pay | Admitting: Cardiology

## 2021-11-13 DIAGNOSIS — I10 Essential (primary) hypertension: Secondary | ICD-10-CM

## 2021-12-11 ENCOUNTER — Other Ambulatory Visit: Payer: Self-pay | Admitting: Cardiology

## 2021-12-11 DIAGNOSIS — I10 Essential (primary) hypertension: Secondary | ICD-10-CM

## 2021-12-16 ENCOUNTER — Ambulatory Visit (INDEPENDENT_AMBULATORY_CARE_PROVIDER_SITE_OTHER): Payer: Medicare Other

## 2021-12-16 DIAGNOSIS — I442 Atrioventricular block, complete: Secondary | ICD-10-CM | POA: Diagnosis not present

## 2021-12-16 LAB — CUP PACEART REMOTE DEVICE CHECK
Battery Remaining Longevity: 122 mo
Battery Remaining Percentage: 95.5 %
Battery Voltage: 3.01 V
Brady Statistic AP VP Percent: 1 %
Brady Statistic AP VS Percent: 1 %
Brady Statistic AS VP Percent: 99 %
Brady Statistic AS VS Percent: 1 %
Brady Statistic RA Percent Paced: 1 %
Brady Statistic RV Percent Paced: 99 %
Date Time Interrogation Session: 20231207020018
Implantable Lead Connection Status: 753985
Implantable Lead Connection Status: 753985
Implantable Lead Implant Date: 20230602
Implantable Lead Implant Date: 20230602
Implantable Lead Location: 753859
Implantable Lead Location: 753860
Implantable Pulse Generator Implant Date: 20230602
Lead Channel Impedance Value: 410 Ohm
Lead Channel Impedance Value: 440 Ohm
Lead Channel Pacing Threshold Amplitude: 0.375 V
Lead Channel Pacing Threshold Amplitude: 0.5 V
Lead Channel Pacing Threshold Pulse Width: 0.5 ms
Lead Channel Pacing Threshold Pulse Width: 0.5 ms
Lead Channel Sensing Intrinsic Amplitude: 2.6 mV
Lead Channel Setting Pacing Amplitude: 0.625
Lead Channel Setting Pacing Amplitude: 1.5 V
Lead Channel Setting Pacing Pulse Width: 0.5 ms
Lead Channel Setting Sensing Sensitivity: 4 mV
Pulse Gen Model: 2272
Pulse Gen Serial Number: 8081700

## 2021-12-27 ENCOUNTER — Encounter: Payer: Self-pay | Admitting: Cardiology

## 2021-12-27 ENCOUNTER — Ambulatory Visit: Payer: Medicare Other | Admitting: Cardiology

## 2021-12-27 VITALS — BP 159/66 | HR 91 | Resp 16 | Ht 63.0 in | Wt 183.0 lb

## 2021-12-27 DIAGNOSIS — I10 Essential (primary) hypertension: Secondary | ICD-10-CM

## 2021-12-27 DIAGNOSIS — I442 Atrioventricular block, complete: Secondary | ICD-10-CM

## 2021-12-27 NOTE — Progress Notes (Signed)
Patient referred by Leonard Downing, * for complete AV block  Subjective:   Felicia Davidson, female    DOB: Sep 15, 1945, 76 y.o.   MRN: [id number]   Chief Complaint  Patient presents with   Hypertension   Follow-up    3 month     HPI  76 y.o. 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 Leg edema improved, but persists.    Current Outpatient Medications:    amLODipine (NORVASC) 5 MG tablet, Take 1 tablet (5 mg total) by mouth daily. Stop taking amlodipine 10 mg., Disp: 90 tablet, Rfl: 0   carvedilol (COREG) 6.25 MG tablet, Take 1 tablet (6.25 mg total) by mouth 2 (two) times daily., Disp: 60 tablet, Rfl: 2   ferrous sulfate 325 (65 FE) MG tablet, Take 325 mg by mouth daily with breakfast. , Disp: , Rfl:    furosemide (LASIX) 40 MG tablet, Take 1 tablet (40 mg total) by mouth daily., Disp: 30 tablet, Rfl: 1   glipiZIDE (GLUCOTROL) 10 MG tablet, Take 5 mg by mouth 2 (two) times daily before a meal., Disp: , Rfl: 1   losartan (COZAAR) 25 MG tablet, Take 1 tablet (25 mg total) by mouth daily., Disp: 30 tablet, Rfl: 1   metFORMIN (GLUCOPHAGE) 1000 MG tablet, Take 1,000 mg by mouth 2 (two) times daily with a meal., Disp: , Rfl:    rosuvastatin (CRESTOR) 10 MG tablet, Take 10 mg by mouth daily. , Disp: , Rfl: 1   vitamin B-12 (CYANOCOBALAMIN) 500 MCG tablet, Take 500 mcg by mouth daily., Disp: , Rfl:    Cardiovascular and other pertinent studies:  Reviewed external labs and tests, independently interpreted  Remote pacer check 12/16/2021: Scheduled remote reviewed. Normal device function.   There was a six second atrial arrhythmia detected   PPM placement 06/11/2021: Abbott, by Dr. Quentin Ore  Echocardiogram 06/09/2021: 1. Left ventricular ejection fraction, by estimation, is 60 to 65%. The  left ventricle has normal function. The  left ventricle has no regional  wall motion abnormalities. There is mild left ventricular hypertrophy.  Left ventricular diastolic function not   assessed due to complete AV blocl.   2. Right ventricular systolic function is normal. The right ventricular  size is normal.   3. The mitral valve is normal in structure. Mild mitral valve  regurgitation. No evidence of mitral stenosis.   4. The aortic valve is normal in structure. Aortic valve regurgitation is  mild. No aortic stenosis is present.   EKG 06/25/2021: Sinus rhythm 92 bpm First degree AV block LBBB      Recent labs: 06/13/2021: Glucose 131, BUN/Cr 38/1.49. EGFR 36. Na/K 136/4.2. Albumin 2.7. Rest of the CMP normal H/H 9.4/29. MCV 92. Platelets 248 HbA1C 5.9%    Review of Systems  Cardiovascular:  Negative for chest pain, dyspnea on exertion, leg swelling, palpitations and syncope.        Vitals:   12/27/21 1151 12/27/21 1152  BP: (!) 164/63 (!) 159/66  Pulse: (!) 101 91  Resp: 16   SpO2: 96%      Body mass index is 32.42 kg/m. Filed Weights   12/27/21 1151  Weight: 183 lb (83 kg)     Objective:   Physical Exam Vitals and nursing note reviewed.  Constitutional:      General: She is not in acute distress. Neck:  Vascular: No JVD.  Cardiovascular:     Rate and Rhythm: Normal rate and regular rhythm.     Heart sounds: Normal heart sounds. No murmur heard. Pulmonary:     Effort: Pulmonary effort is normal.     Breath sounds: Normal breath sounds. No wheezing or rales.  Musculoskeletal:     Right lower leg: Edema (1+) present.     Left lower leg: Edema (1+) present.         Visit diagnoses:   ICD-10-CM   1. Essential hypertension  I10     2. Complete AV block (HCC)  I44.2         Assessment & Recommendations:    76 y.o. 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     Hypertension: Suspect component of white coat hypertension. No chang made today.  Recommend wearing compression stockings. Unable to add MRA given renal dysfunction.  F/u in 6 months    Nigel Mormon, MD Pager: [phone] Office: [phone]

## 2022-01-01 ENCOUNTER — Other Ambulatory Visit: Payer: Self-pay | Admitting: Cardiology

## 2022-01-01 DIAGNOSIS — I10 Essential (primary) hypertension: Secondary | ICD-10-CM

## 2022-01-07 NOTE — Progress Notes (Signed)
Remote pacemaker transmission.   

## 2022-01-11 ENCOUNTER — Other Ambulatory Visit: Payer: Self-pay | Admitting: Cardiology

## 2022-01-11 DIAGNOSIS — I10 Essential (primary) hypertension: Secondary | ICD-10-CM

## 2022-03-14 ENCOUNTER — Other Ambulatory Visit: Payer: Self-pay | Admitting: Cardiology

## 2022-03-14 DIAGNOSIS — I10 Essential (primary) hypertension: Secondary | ICD-10-CM

## 2022-03-17 ENCOUNTER — Ambulatory Visit: Payer: Medicare Other

## 2022-03-17 DIAGNOSIS — I442 Atrioventricular block, complete: Secondary | ICD-10-CM

## 2022-03-17 LAB — CUP PACEART REMOTE DEVICE CHECK
Battery Remaining Longevity: 121 mo
Battery Remaining Percentage: 95.5 %
Battery Voltage: 3.01 V
Brady Statistic AP VP Percent: 1 %
Brady Statistic AP VS Percent: 1 %
Brady Statistic AS VP Percent: 99 %
Brady Statistic AS VS Percent: 1 %
Brady Statistic RA Percent Paced: 1 %
Brady Statistic RV Percent Paced: 99 %
Date Time Interrogation Session: 20240307020015
Implantable Lead Connection Status: 753985
Implantable Lead Connection Status: 753985
Implantable Lead Implant Date: 20230602
Implantable Lead Implant Date: 20230602
Implantable Lead Location: 753859
Implantable Lead Location: 753860
Implantable Pulse Generator Implant Date: 20230602
Lead Channel Impedance Value: 430 Ohm
Lead Channel Impedance Value: 460 Ohm
Lead Channel Pacing Threshold Amplitude: 0.375 V
Lead Channel Pacing Threshold Amplitude: 0.375 V
Lead Channel Pacing Threshold Pulse Width: 0.5 ms
Lead Channel Pacing Threshold Pulse Width: 0.5 ms
Lead Channel Sensing Intrinsic Amplitude: 3 mV
Lead Channel Setting Pacing Amplitude: 0.625
Lead Channel Setting Pacing Amplitude: 1.375
Lead Channel Setting Pacing Pulse Width: 0.5 ms
Lead Channel Setting Sensing Sensitivity: 4 mV
Pulse Gen Model: 2272
Pulse Gen Serial Number: 8081700

## 2022-04-19 NOTE — Progress Notes (Signed)
Remote pacemaker transmission.   

## 2022-04-25 ENCOUNTER — Other Ambulatory Visit: Payer: Self-pay | Admitting: Cardiology

## 2022-04-25 DIAGNOSIS — I10 Essential (primary) hypertension: Secondary | ICD-10-CM

## 2022-05-17 ENCOUNTER — Other Ambulatory Visit: Payer: Self-pay | Admitting: Cardiology

## 2022-05-17 DIAGNOSIS — I10 Essential (primary) hypertension: Secondary | ICD-10-CM

## 2022-05-18 ENCOUNTER — Other Ambulatory Visit: Payer: Self-pay

## 2022-05-18 DIAGNOSIS — I10 Essential (primary) hypertension: Secondary | ICD-10-CM

## 2022-05-18 MED ORDER — LOSARTAN POTASSIUM 25 MG PO TABS
25.0000 mg | ORAL_TABLET | Freq: Every day | ORAL | 6 refills | Status: DC
Start: 2022-05-18 — End: 2022-12-21

## 2022-06-16 ENCOUNTER — Ambulatory Visit (INDEPENDENT_AMBULATORY_CARE_PROVIDER_SITE_OTHER): Payer: Medicare Other

## 2022-06-16 DIAGNOSIS — I442 Atrioventricular block, complete: Secondary | ICD-10-CM

## 2022-06-16 LAB — CUP PACEART REMOTE DEVICE CHECK
Battery Remaining Longevity: 118 mo
Battery Remaining Percentage: 93 %
Battery Voltage: 3.01 V
Brady Statistic AP VP Percent: 1 %
Brady Statistic AP VS Percent: 1 %
Brady Statistic AS VP Percent: 99 %
Brady Statistic AS VS Percent: 1 %
Brady Statistic RA Percent Paced: 1 %
Brady Statistic RV Percent Paced: 99 %
Date Time Interrogation Session: 20240606020020
Implantable Lead Connection Status: 753985
Implantable Lead Connection Status: 753985
Implantable Lead Implant Date: 20230602
Implantable Lead Implant Date: 20230602
Implantable Lead Location: 753859
Implantable Lead Location: 753860
Implantable Pulse Generator Implant Date: 20230602
Lead Channel Impedance Value: 410 Ohm
Lead Channel Impedance Value: 450 Ohm
Lead Channel Pacing Threshold Amplitude: 0.375 V
Lead Channel Pacing Threshold Amplitude: 0.375 V
Lead Channel Pacing Threshold Pulse Width: 0.5 ms
Lead Channel Pacing Threshold Pulse Width: 0.5 ms
Lead Channel Sensing Intrinsic Amplitude: 3.5 mV
Lead Channel Setting Pacing Amplitude: 0.625
Lead Channel Setting Pacing Amplitude: 1.375
Lead Channel Setting Pacing Pulse Width: 0.5 ms
Lead Channel Setting Sensing Sensitivity: 4 mV
Pulse Gen Model: 2272
Pulse Gen Serial Number: 8081700

## 2022-06-29 ENCOUNTER — Encounter: Payer: Self-pay | Admitting: Cardiology

## 2022-06-29 ENCOUNTER — Ambulatory Visit: Payer: Medicare Other | Admitting: Cardiology

## 2022-06-29 VITALS — BP 156/59 | HR 92 | Ht 63.0 in | Wt 177.0 lb

## 2022-06-29 DIAGNOSIS — I442 Atrioventricular block, complete: Secondary | ICD-10-CM

## 2022-06-29 DIAGNOSIS — I10 Essential (primary) hypertension: Secondary | ICD-10-CM

## 2022-06-29 NOTE — Progress Notes (Signed)
Patient referred by Kaleen Mask, * for complete AV block  Subjective:   Felicia Davidson, female    DOB: Feb 03, 1945, 77 y.o.   MRN: [id number]   Chief Complaint  Patient presents with   Hypertension   Follow-up     HPI  77 y.o. 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  Leg swelling significantly improved.  Blood pressure elevated today, but always controlled at home, based on her home blood pressure log.   Current Outpatient Medications:    amLODipine (NORVASC) 5 MG tablet, TAKE 1 TABLET BY MOUTH DAILY, Disp: 90 tablet, Rfl: 1   carvedilol (COREG) 6.25 MG tablet, TAKE 1 TABLET BY MOUTH TWICE DAILY, Disp: 60 tablet, Rfl: 2   ferrous sulfate 325 (65 FE) MG tablet, Take 325 mg by mouth daily with breakfast. , Disp: , Rfl:    furosemide (LASIX) 40 MG tablet, TAKE 1 TABLET BY MOUTH DAILY, Disp: 30 tablet, Rfl: 1   losartan (COZAAR) 25 MG tablet, Take 1 tablet (25 mg total) by mouth daily., Disp: 30 tablet, Rfl: 6   metFORMIN (GLUCOPHAGE) 1000 MG tablet, Take 1,000 mg by mouth 2 (two) times daily with a meal., Disp: , Rfl:    rosuvastatin (CRESTOR) 10 MG tablet, Take 10 mg by mouth daily. , Disp: , Rfl: 1   vitamin B-12 (CYANOCOBALAMIN) 500 MCG tablet, Take 500 mcg by mouth daily., Disp: , Rfl:    Cardiovascular and other pertinent studies:  Reviewed external labs and tests, independently interpreted  EKG 06/29/2022: Sinus rhythm 90 bpm Ventricular pacing  Remote pacer check 06/16/2022: Scheduled remote reviewed. Normal device function.   There were 3 atrial arrhythmias detected that were all less than 1 minute  Next remote 91 days.  1% A pacing, 99% RV pacing   Remote pacer check 12/16/2021: Scheduled remote reviewed. Normal device function.   There was a six second atrial arrhythmia detected   PPM placement 06/11/2021: Abbott, by Dr. Lalla Brothers  Echocardiogram  06/09/2021: 1. Left ventricular ejection fraction, by estimation, is 60 to 65%. The  left ventricle has normal function. The left ventricle has no regional  wall motion abnormalities. There is mild left ventricular hypertrophy.  Left ventricular diastolic function not   assessed due to complete AV blocl.   2. Right ventricular systolic function is normal. The right ventricular  size is normal.   3. The mitral valve is normal in structure. Mild mitral valve  regurgitation. No evidence of mitral stenosis.   4. The aortic valve is normal in structure. Aortic valve regurgitation is  mild. No aortic stenosis is present.   Recent labs: 06/13/2021: Glucose 131, BUN/Cr 38/1.49. EGFR 36. Na/K 136/4.2. Albumin 2.7. Rest of the CMP normal H/H 9.4/29. MCV 92. Platelets 248 HbA1C 5.9%    Review of Systems  Cardiovascular:  Negative for chest pain, dyspnea on exertion, leg swelling, palpitations and syncope.        Vitals:   06/29/22 1116 06/29/22 1119  BP: (!) 172/72 (!) 156/59  Pulse: 94 92  SpO2: 98% 98%     Body mass index is 31.35 kg/m. Filed Weights   06/29/22 1116  Weight: 177 lb (80.3 kg)     Objective:   Physical Exam Vitals and nursing note reviewed.  Constitutional:      General: She is not in acute distress. Neck:     Vascular: No JVD.  Cardiovascular:  Rate and Rhythm: Normal rate and regular rhythm.     Heart sounds: Normal heart sounds. No murmur heard. Pulmonary:     Effort: Pulmonary effort is normal.     Breath sounds: Normal breath sounds. No wheezing or rales.  Musculoskeletal:     Right lower leg: Edema (1+) present.     Left lower leg: Edema (1+) present.         Visit diagnoses:   ICD-10-CM   1. Complete AV block (HCC)  I44.2 EKG 12-Lead    2. Essential hypertension  I10         Assessment & Recommendations:    77 y.o. Caucasian female with hypertension, type 2 DM, CKD, neuropathy, anemia, complete AV block, now s/p PPM  (06/2021)  Complete AV block: S/p pacemaker placement.   Functioning well.  Hypertension: Suspect component of white coat hypertension. No change made today.  Recommend regular follow-up with PCP, and calibrating her blood pressure monitor with their office monitor.  F/u in 1 year     Elder Negus, MD Pager: [phone] Office: [phone]

## 2022-07-05 NOTE — Progress Notes (Signed)
Remote pacemaker transmission.   

## 2022-07-12 ENCOUNTER — Other Ambulatory Visit: Payer: Self-pay | Admitting: Cardiology

## 2022-07-12 DIAGNOSIS — I10 Essential (primary) hypertension: Secondary | ICD-10-CM

## 2022-08-13 IMAGING — DX DG CHEST 1V PORT
1 series · 1 of 1 positions shown · non-contrast
Comparison: June 10, 2021.

CLINICAL DATA: Pulmonary edema.

EXAM:
PORTABLE CHEST 1 VIEW

[chest]
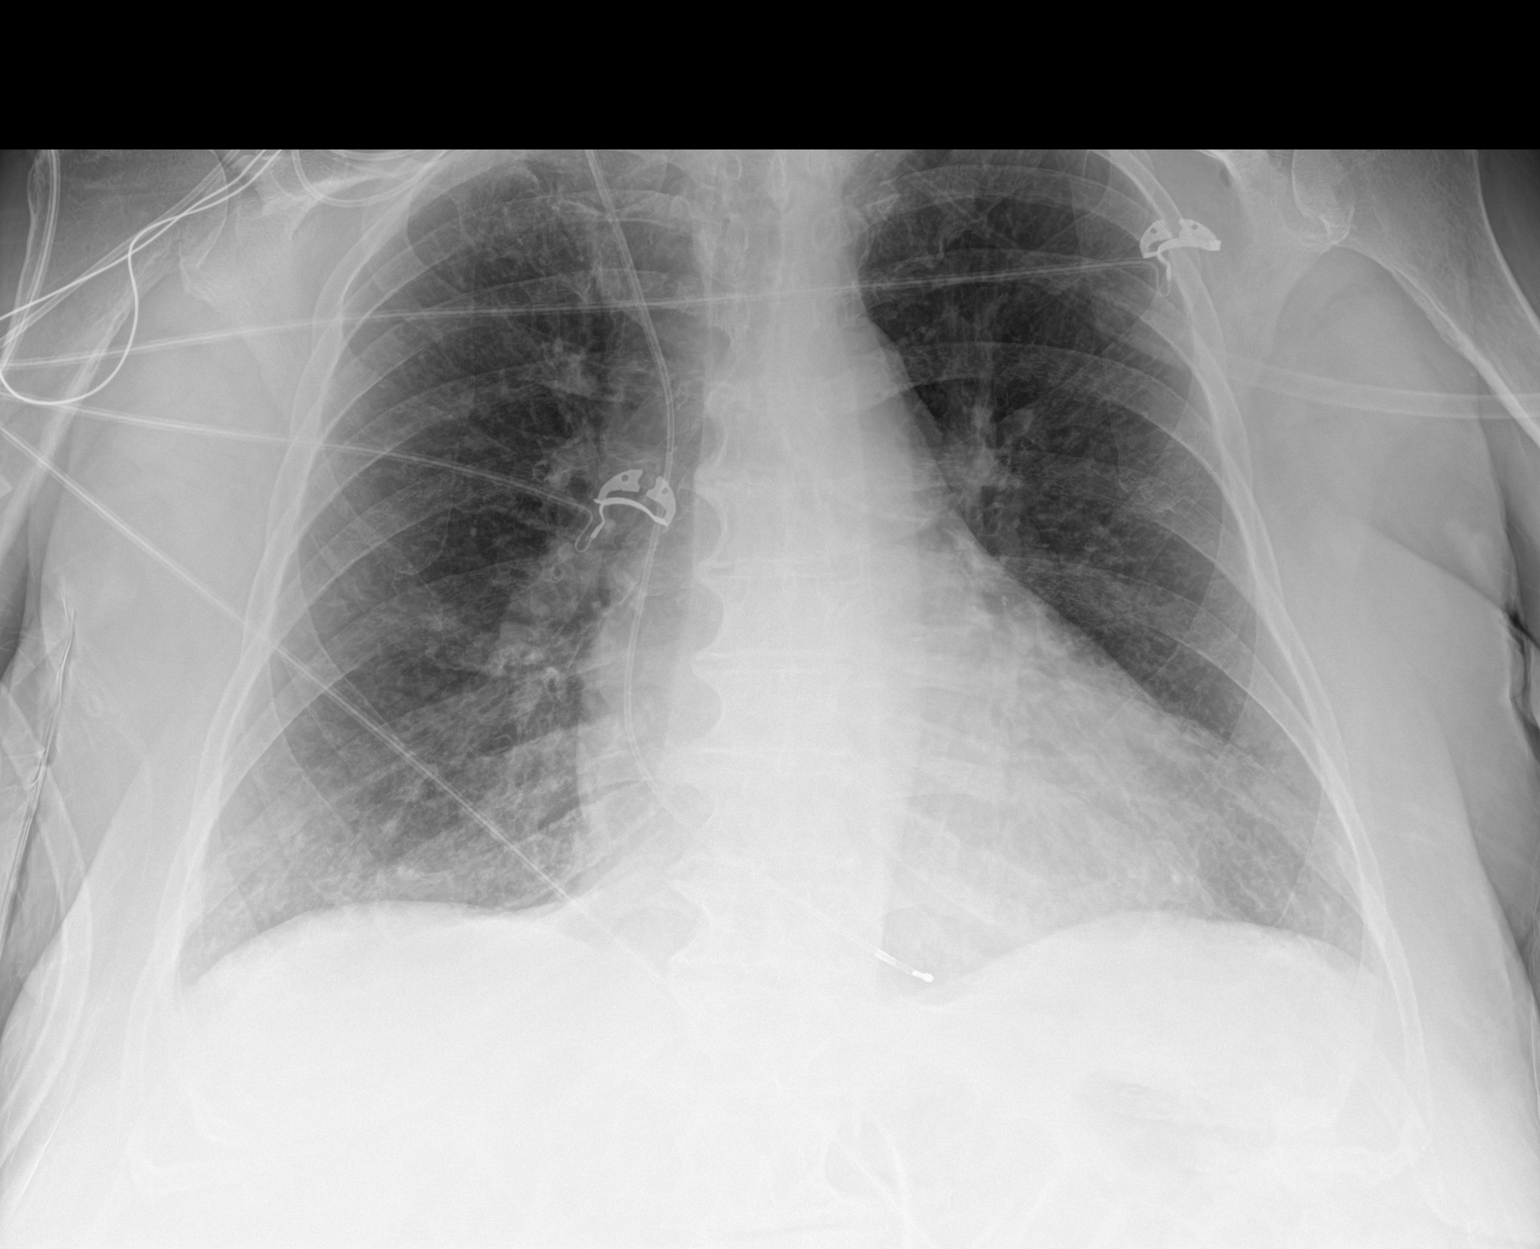

[1 of 1 positions shown; findings below may reference images not displayed]

FINDINGS: Stable cardiomediastinal silhouette. Stable position of right
internal jugular pacing device with tip projected over expected
position of right ventricle. Lungs are clear. Bony thorax is
unremarkable
IMPRESSION: No active disease.

## 2022-08-14 IMAGING — DX DG CHEST 2V
2 series · 2 of 2 positions shown · non-contrast
Comparison: 06/11/2021 at [DATE] a.m.

CLINICAL DATA: Follow-up post pacemaker insertion.

EXAM:
CHEST - 2 VIEW

[chest lat]
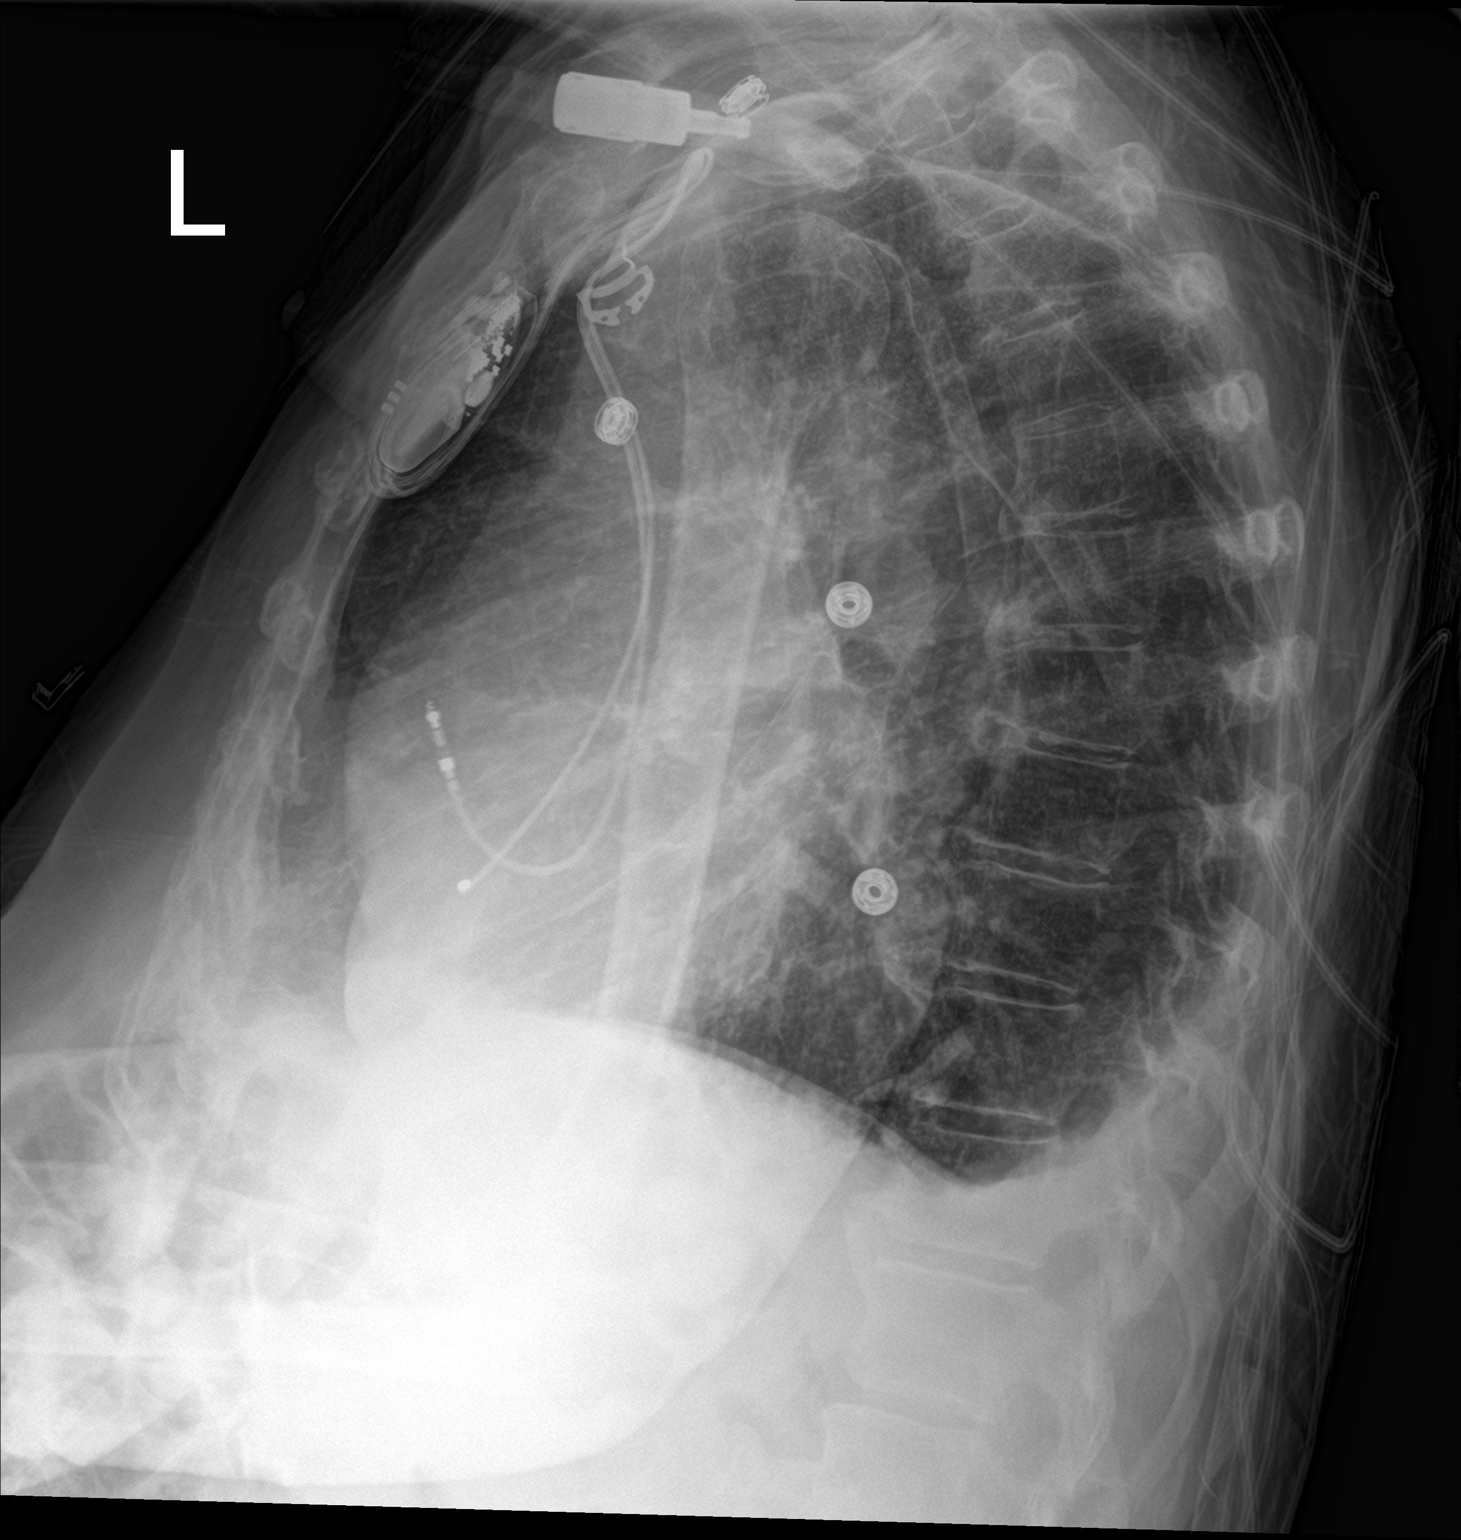

[chest ap]
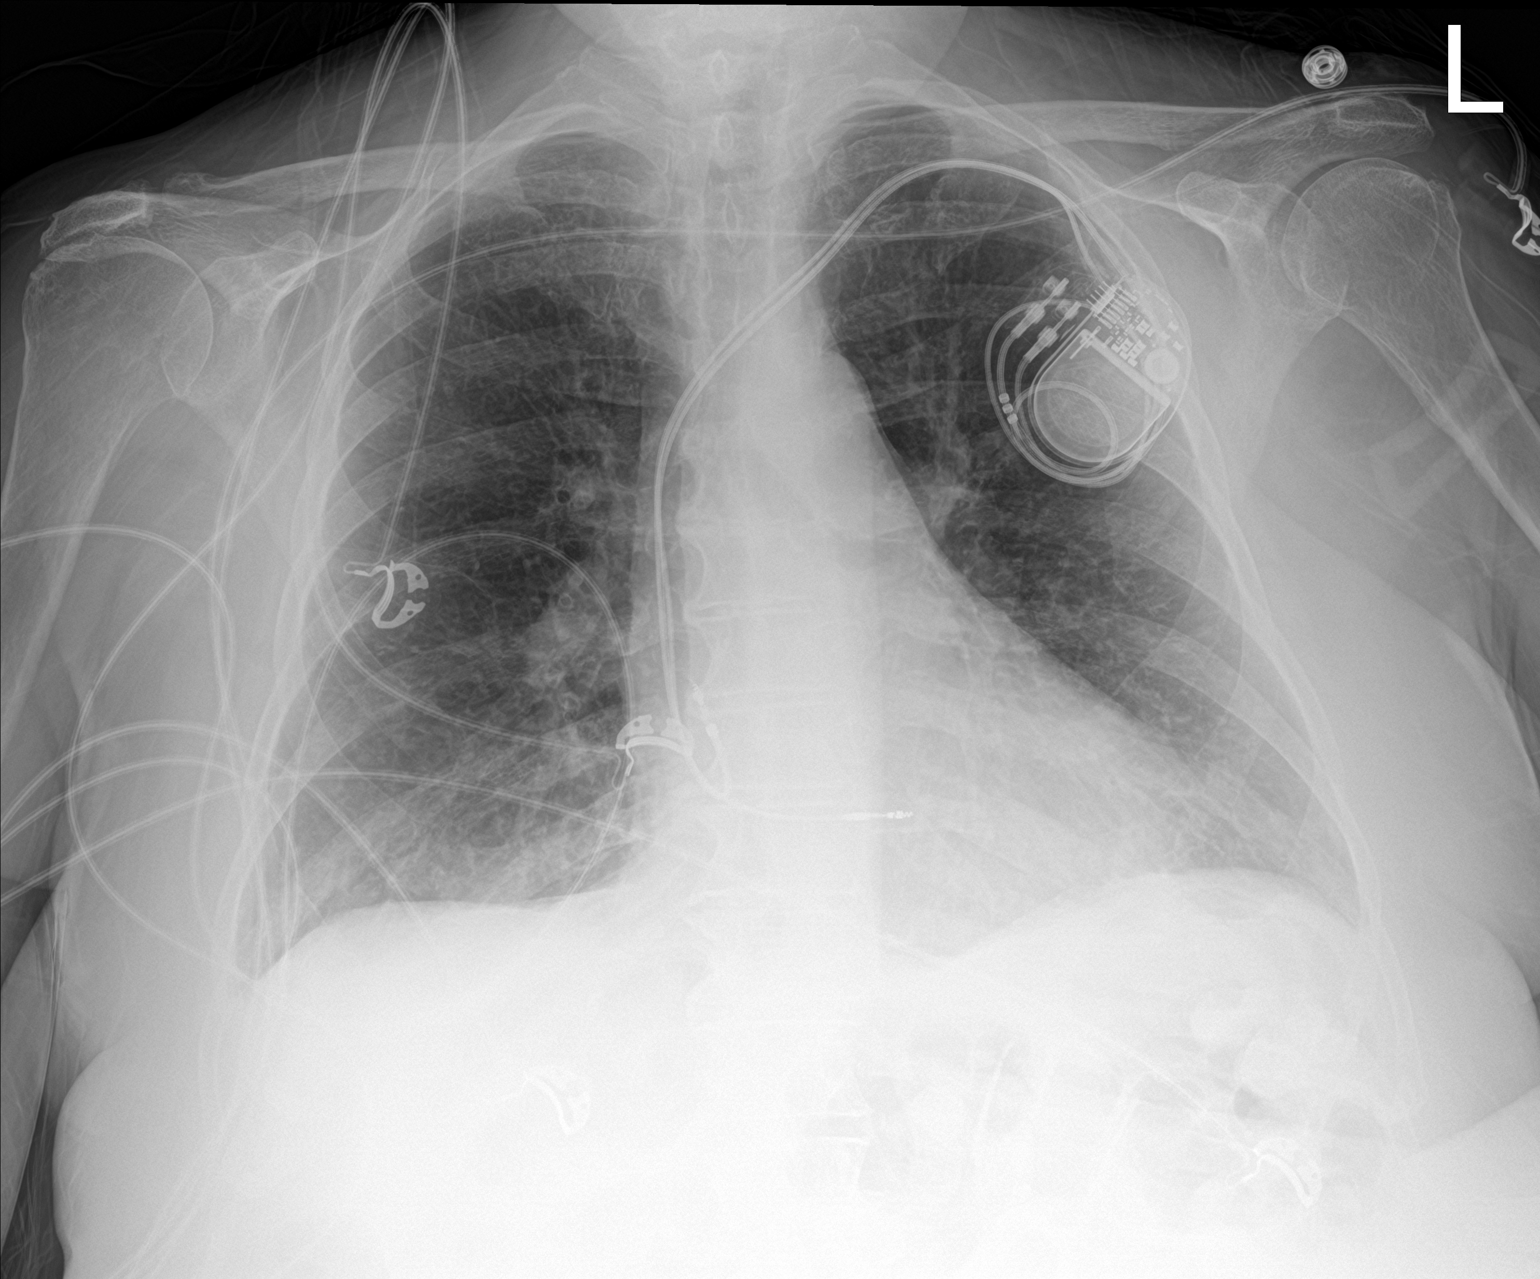

[2 of 2 positions shown; findings below may reference images not displayed]

FINDINGS: [DATE] a.m. 06/12/2021.  There are multiple overlying monitor wires.

There is interval new demonstration of a left chest dual lead pacing
system with 1 wire terminating in the region of the right atrium and
the other in the right ventricle but appears to be in the more
proximal rather than the distal ventricle.

Heart size and vascular pattern are normal. The mediastinum is
normally configured.

No pleural effusion is seen. The lungs are clear. Osteopenia and
thoracic spondylosis.
IMPRESSION: Left chest dual lead pacemaker. One of the wires terminates in the
right atrium, the other in the right ventricle but appears to be in
the proximal to mid ventricle rather than the usual location of the
distal ventricle.

No pneumothorax or other acute chest process. Interval removal right
IJ pacing wire.

## 2022-09-03 ENCOUNTER — Other Ambulatory Visit: Payer: Self-pay | Admitting: Cardiology

## 2022-09-03 DIAGNOSIS — I10 Essential (primary) hypertension: Secondary | ICD-10-CM

## 2022-09-15 ENCOUNTER — Ambulatory Visit (INDEPENDENT_AMBULATORY_CARE_PROVIDER_SITE_OTHER): Payer: Medicare Other

## 2022-09-15 DIAGNOSIS — I442 Atrioventricular block, complete: Secondary | ICD-10-CM

## 2022-09-15 LAB — CUP PACEART REMOTE DEVICE CHECK
Battery Remaining Longevity: 114 mo
Battery Remaining Percentage: 91 %
Battery Voltage: 3.01 V
Brady Statistic AP VP Percent: 1 %
Brady Statistic AP VS Percent: 1 %
Brady Statistic AS VP Percent: 99 %
Brady Statistic AS VS Percent: 1 %
Brady Statistic RA Percent Paced: 1 %
Brady Statistic RV Percent Paced: 99 %
Date Time Interrogation Session: 20240905023925
Implantable Lead Connection Status: 753985
Implantable Lead Connection Status: 753985
Implantable Lead Implant Date: 20230602
Implantable Lead Implant Date: 20230602
Implantable Lead Location: 753859
Implantable Lead Location: 753860
Implantable Pulse Generator Implant Date: 20230602
Lead Channel Impedance Value: 430 Ohm
Lead Channel Impedance Value: 450 Ohm
Lead Channel Pacing Threshold Amplitude: 0.375 V
Lead Channel Pacing Threshold Amplitude: 0.5 V
Lead Channel Pacing Threshold Pulse Width: 0.5 ms
Lead Channel Pacing Threshold Pulse Width: 0.5 ms
Lead Channel Sensing Intrinsic Amplitude: 3.6 mV
Lead Channel Setting Pacing Amplitude: 0.75 V
Lead Channel Setting Pacing Amplitude: 1.375
Lead Channel Setting Pacing Pulse Width: 0.5 ms
Lead Channel Setting Sensing Sensitivity: 4 mV
Pulse Gen Model: 2272
Pulse Gen Serial Number: 8081700

## 2022-09-17 ENCOUNTER — Other Ambulatory Visit: Payer: Self-pay | Admitting: Cardiology

## 2022-09-17 DIAGNOSIS — I10 Essential (primary) hypertension: Secondary | ICD-10-CM

## 2022-09-20 ENCOUNTER — Other Ambulatory Visit: Payer: Self-pay | Admitting: Cardiology

## 2022-09-20 DIAGNOSIS — I10 Essential (primary) hypertension: Secondary | ICD-10-CM

## 2022-09-20 NOTE — Progress Notes (Signed)
Remote pacemaker transmission.   

## 2022-11-26 ENCOUNTER — Other Ambulatory Visit: Payer: Self-pay | Admitting: Cardiology

## 2022-11-26 DIAGNOSIS — I10 Essential (primary) hypertension: Secondary | ICD-10-CM

## 2022-12-12 ENCOUNTER — Other Ambulatory Visit: Payer: Self-pay | Admitting: Cardiology

## 2022-12-12 DIAGNOSIS — I10 Essential (primary) hypertension: Secondary | ICD-10-CM

## 2022-12-15 ENCOUNTER — Ambulatory Visit (INDEPENDENT_AMBULATORY_CARE_PROVIDER_SITE_OTHER): Payer: Medicare Other

## 2022-12-15 DIAGNOSIS — I442 Atrioventricular block, complete: Secondary | ICD-10-CM

## 2022-12-15 LAB — CUP PACEART REMOTE DEVICE CHECK
Battery Remaining Longevity: 110 mo
Battery Remaining Percentage: 88 %
Battery Voltage: 3.01 V
Brady Statistic AP VP Percent: 1 %
Brady Statistic AP VS Percent: 1 %
Brady Statistic AS VP Percent: 99 %
Brady Statistic AS VS Percent: 1 %
Brady Statistic RA Percent Paced: 1 %
Brady Statistic RV Percent Paced: 99 %
Date Time Interrogation Session: 20241205032440
Implantable Lead Connection Status: 753985
Implantable Lead Connection Status: 753985
Implantable Lead Implant Date: 20230602
Implantable Lead Implant Date: 20230602
Implantable Lead Location: 753859
Implantable Lead Location: 753860
Implantable Pulse Generator Implant Date: 20230602
Lead Channel Impedance Value: 430 Ohm
Lead Channel Impedance Value: 450 Ohm
Lead Channel Pacing Threshold Amplitude: 0.375 V
Lead Channel Pacing Threshold Amplitude: 0.5 V
Lead Channel Pacing Threshold Pulse Width: 0.5 ms
Lead Channel Pacing Threshold Pulse Width: 0.5 ms
Lead Channel Sensing Intrinsic Amplitude: 12 mV
Lead Channel Sensing Intrinsic Amplitude: 4.1 mV
Lead Channel Setting Pacing Amplitude: 0.625
Lead Channel Setting Pacing Amplitude: 1.5 V
Lead Channel Setting Pacing Pulse Width: 0.5 ms
Lead Channel Setting Sensing Sensitivity: 4 mV
Pulse Gen Model: 2272
Pulse Gen Serial Number: 8081700

## 2022-12-19 ENCOUNTER — Other Ambulatory Visit: Payer: Self-pay | Admitting: Cardiology

## 2022-12-19 DIAGNOSIS — I10 Essential (primary) hypertension: Secondary | ICD-10-CM

## 2023-03-04 ENCOUNTER — Other Ambulatory Visit: Payer: Self-pay | Admitting: Cardiology

## 2023-03-04 DIAGNOSIS — I10 Essential (primary) hypertension: Secondary | ICD-10-CM

## 2023-03-16 ENCOUNTER — Ambulatory Visit (INDEPENDENT_AMBULATORY_CARE_PROVIDER_SITE_OTHER): Payer: Medicare Other

## 2023-03-16 DIAGNOSIS — I442 Atrioventricular block, complete: Secondary | ICD-10-CM | POA: Diagnosis not present

## 2023-03-18 LAB — CUP PACEART REMOTE DEVICE CHECK
Battery Remaining Longevity: 108 mo
Battery Remaining Percentage: 86 %
Battery Voltage: 3.01 V
Brady Statistic AP VP Percent: 1 %
Brady Statistic AP VS Percent: 1 %
Brady Statistic AS VP Percent: 99 %
Brady Statistic AS VS Percent: 1 %
Brady Statistic RA Percent Paced: 1 %
Brady Statistic RV Percent Paced: 99 %
Date Time Interrogation Session: 20250306020016
Implantable Lead Connection Status: 753985
Implantable Lead Connection Status: 753985
Implantable Lead Implant Date: 20230602
Implantable Lead Implant Date: 20230602
Implantable Lead Location: 753859
Implantable Lead Location: 753860
Implantable Pulse Generator Implant Date: 20230602
Lead Channel Impedance Value: 400 Ohm
Lead Channel Impedance Value: 430 Ohm
Lead Channel Pacing Threshold Amplitude: 0.375 V
Lead Channel Pacing Threshold Amplitude: 0.375 V
Lead Channel Pacing Threshold Pulse Width: 0.5 ms
Lead Channel Pacing Threshold Pulse Width: 0.5 ms
Lead Channel Sensing Intrinsic Amplitude: 12 mV
Lead Channel Sensing Intrinsic Amplitude: 2.8 mV
Lead Channel Setting Pacing Amplitude: 0.625
Lead Channel Setting Pacing Amplitude: 1.375
Lead Channel Setting Pacing Pulse Width: 0.5 ms
Lead Channel Setting Sensing Sensitivity: 4 mV
Pulse Gen Model: 2272
Pulse Gen Serial Number: 8081700

## 2023-04-20 NOTE — Addendum Note (Signed)
 Addended by: Elease Etienne A on: 04/20/2023 03:27 PM   Modules accepted: Orders

## 2023-04-20 NOTE — Progress Notes (Signed)
 Remote pacemaker transmission.

## 2023-06-15 ENCOUNTER — Ambulatory Visit (INDEPENDENT_AMBULATORY_CARE_PROVIDER_SITE_OTHER): Payer: Medicare Other

## 2023-06-15 DIAGNOSIS — I442 Atrioventricular block, complete: Secondary | ICD-10-CM | POA: Diagnosis not present

## 2023-06-16 LAB — CUP PACEART REMOTE DEVICE CHECK
Battery Remaining Longevity: 106 mo
Battery Remaining Percentage: 84 %
Battery Voltage: 3.01 V
Brady Statistic AP VP Percent: 1 %
Brady Statistic AP VS Percent: 1 %
Brady Statistic AS VP Percent: 99 %
Brady Statistic AS VS Percent: 1 %
Brady Statistic RA Percent Paced: 1 %
Brady Statistic RV Percent Paced: 99 %
Date Time Interrogation Session: 20250605020013
Implantable Lead Connection Status: 753985
Implantable Lead Connection Status: 753985
Implantable Lead Implant Date: 20230602
Implantable Lead Implant Date: 20230602
Implantable Lead Location: 753859
Implantable Lead Location: 753860
Implantable Pulse Generator Implant Date: 20230602
Lead Channel Impedance Value: 410 Ohm
Lead Channel Impedance Value: 440 Ohm
Lead Channel Pacing Threshold Amplitude: 0.375 V
Lead Channel Pacing Threshold Amplitude: 0.375 V
Lead Channel Pacing Threshold Pulse Width: 0.5 ms
Lead Channel Pacing Threshold Pulse Width: 0.5 ms
Lead Channel Sensing Intrinsic Amplitude: 12 mV
Lead Channel Sensing Intrinsic Amplitude: 3.2 mV
Lead Channel Setting Pacing Amplitude: 0.625
Lead Channel Setting Pacing Amplitude: 1.375
Lead Channel Setting Pacing Pulse Width: 0.5 ms
Lead Channel Setting Sensing Sensitivity: 4 mV
Pulse Gen Model: 2272
Pulse Gen Serial Number: 8081700

## 2023-06-17 ENCOUNTER — Other Ambulatory Visit: Payer: Self-pay | Admitting: Cardiology

## 2023-06-17 DIAGNOSIS — I10 Essential (primary) hypertension: Secondary | ICD-10-CM

## 2023-06-24 ENCOUNTER — Other Ambulatory Visit: Payer: Self-pay | Admitting: Cardiology

## 2023-06-24 DIAGNOSIS — I10 Essential (primary) hypertension: Secondary | ICD-10-CM

## 2023-06-26 ENCOUNTER — Ambulatory Visit: Payer: Self-pay | Admitting: Cardiology

## 2023-06-29 ENCOUNTER — Ambulatory Visit: Payer: Self-pay | Admitting: Cardiology

## 2023-07-15 ENCOUNTER — Other Ambulatory Visit: Payer: Self-pay | Admitting: Cardiology

## 2023-07-15 DIAGNOSIS — I10 Essential (primary) hypertension: Secondary | ICD-10-CM

## 2023-08-01 NOTE — Addendum Note (Signed)
 Addended by: VICCI SELLER A on: 08/01/2023 12:07 PM   Modules accepted: Orders

## 2023-08-01 NOTE — Progress Notes (Signed)
 Remote pacemaker transmission.

## 2023-08-26 ENCOUNTER — Other Ambulatory Visit: Payer: Self-pay | Admitting: Cardiology

## 2023-08-26 DIAGNOSIS — I10 Essential (primary) hypertension: Secondary | ICD-10-CM

## 2023-09-06 ENCOUNTER — Other Ambulatory Visit: Payer: Self-pay | Admitting: Cardiology

## 2023-09-06 DIAGNOSIS — I10 Essential (primary) hypertension: Secondary | ICD-10-CM

## 2023-09-07 MED ORDER — CARVEDILOL 6.25 MG PO TABS: 6.2500 mg | ORAL_TABLET | Freq: Two times a day (BID) | ORAL | 0 refills | Status: DC

## 2023-09-07 MED ORDER — LOSARTAN POTASSIUM 25 MG PO TABS: 25.0000 mg | ORAL_TABLET | Freq: Every day | ORAL | 0 refills | Status: DC

## 2023-09-14 ENCOUNTER — Ambulatory Visit (INDEPENDENT_AMBULATORY_CARE_PROVIDER_SITE_OTHER): Payer: Medicare Other

## 2023-09-14 DIAGNOSIS — I442 Atrioventricular block, complete: Secondary | ICD-10-CM

## 2023-09-15 LAB — CUP PACEART REMOTE DEVICE CHECK
Battery Remaining Longevity: 103 mo
Battery Remaining Percentage: 81 %
Battery Voltage: 3.01 V
Brady Statistic AP VP Percent: 1 %
Brady Statistic AP VS Percent: 1 %
Brady Statistic AS VP Percent: 99 %
Brady Statistic AS VS Percent: 1 %
Brady Statistic RA Percent Paced: 1 %
Brady Statistic RV Percent Paced: 99 %
Date Time Interrogation Session: 20250904023126
Implantable Lead Connection Status: 753985
Implantable Lead Connection Status: 753985
Implantable Lead Implant Date: 20230602
Implantable Lead Implant Date: 20230602
Implantable Lead Location: 753859
Implantable Lead Location: 753860
Implantable Pulse Generator Implant Date: 20230602
Lead Channel Impedance Value: 430 Ohm
Lead Channel Impedance Value: 450 Ohm
Lead Channel Pacing Threshold Amplitude: 0.375 V
Lead Channel Pacing Threshold Amplitude: 0.375 V
Lead Channel Pacing Threshold Pulse Width: 0.5 ms
Lead Channel Pacing Threshold Pulse Width: 0.5 ms
Lead Channel Sensing Intrinsic Amplitude: 1.6 mV
Lead Channel Sensing Intrinsic Amplitude: 12 mV
Lead Channel Setting Pacing Amplitude: 0.625
Lead Channel Setting Pacing Amplitude: 1.375
Lead Channel Setting Pacing Pulse Width: 0.5 ms
Lead Channel Setting Sensing Sensitivity: 4 mV
Pulse Gen Model: 2272
Pulse Gen Serial Number: 8081700

## 2023-09-17 ENCOUNTER — Ambulatory Visit: Payer: Self-pay | Admitting: Cardiology

## 2023-09-19 ENCOUNTER — Other Ambulatory Visit: Payer: Self-pay | Admitting: Cardiology

## 2023-09-19 DIAGNOSIS — I10 Essential (primary) hypertension: Secondary | ICD-10-CM

## 2023-09-23 NOTE — Progress Notes (Signed)
 Remote PPM Transmission

## 2023-10-06 ENCOUNTER — Other Ambulatory Visit: Payer: Self-pay | Admitting: Cardiology

## 2023-10-06 DIAGNOSIS — I10 Essential (primary) hypertension: Secondary | ICD-10-CM

## 2023-10-07 ENCOUNTER — Other Ambulatory Visit: Payer: Self-pay | Admitting: Cardiology

## 2023-10-07 DIAGNOSIS — I10 Essential (primary) hypertension: Secondary | ICD-10-CM

## 2023-10-12 ENCOUNTER — Telehealth: Payer: Self-pay | Admitting: Cardiology

## 2023-10-12 DIAGNOSIS — I10 Essential (primary) hypertension: Secondary | ICD-10-CM

## 2023-10-12 MED ORDER — FUROSEMIDE 40 MG PO TABS: 40.0000 mg | ORAL_TABLET | Freq: Every day | ORAL | 1 refills | Status: DC

## 2023-10-12 MED ORDER — LOSARTAN POTASSIUM 25 MG PO TABS: 25.0000 mg | ORAL_TABLET | Freq: Every day | ORAL | 1 refills | Status: DC

## 2023-10-12 MED ORDER — CARVEDILOL 6.25 MG PO TABS: 6.2500 mg | ORAL_TABLET | Freq: Two times a day (BID) | ORAL | 1 refills | Status: DC

## 2023-10-12 NOTE — Telephone Encounter (Signed)
*  STAT* If patient is at the pharmacy, call can be transferred to refill team.   1. Which medications need to be refilled? (please list name of each medication and dose if known) carvedilol  (COREG ) 6.25 MG tablet   furosemide  (LASIX ) 40 MG tablet    losartan  (COZAAR ) 25 MG tablet    2. Which pharmacy/location (including street and city if local pharmacy) is medication to be sent to? [facility] - [place], [place] - [address]   3. Do they need a 30 day or 90 day supply? 90  Patient has appt on 11/20

## 2023-10-12 NOTE — Telephone Encounter (Signed)
 Pt's medications were sent to pt's pharmacy as requested. Confirmation received.

## 2023-11-29 NOTE — Progress Notes (Signed)
  Cardiology Office Note:  .   Date:  11/29/2023  ID:  Felicia Davidson, DOB May 22, 1945, MRN [id number] PCP: Loring Tanda Mae, MD  [facility] Providers Cardiologist:  Newman Lawrence, MD PCP: Loring Tanda Mae, MD  Chief Complaint  Patient presents with   Complete AV block     Felicia Davidson is a 78 y.o. female with hypertension, type 2 DM, CKD, neuropathy, anemia, s/p PPM fpr complete AV block    History of Present Illness  Patient is doing well. She is compliant with her medications. She has regular f/u w/her pCP.      Vitals:   11/30/23 1303  BP: (!) 128/50  Pulse: 91  SpO2: 98%      Review of Systems  Cardiovascular:  Negative for chest pain, dyspnea on exertion, leg swelling, palpitations and syncope.        Studies Reviewed: SABRA        ICD check 09/2023: Remote pacemaker interrogation. Presenting Rhythm:A-sensed V-paced. Battery and lead parameters stable with stable capture and sensing. Device programming is appropriate. Continue remote monitoring.   EKG 11/30/2023: Normal sinus rhythm Septal infarct , age undetermined ST & T wave abnormality, consider inferolateral ischemia When compared with ECG of 13-Jun-2021 05:03, Sinus rhythm has replaced Electronic ventricular pacemaker   Device interrogation 09/2023: PPM Scheduled remote reviewed. Normal device function.  Presenting rhythm: AS-VP.  1 AHR detection lasting 10 seconds.   Echocardiogram 06/09/2021: 1. Left ventricular ejection fraction, by estimation, is 60 to 65%. The  left ventricle has normal function. The left ventricle has no regional  wall motion abnormalities. There is mild left ventricular hypertrophy.  Left ventricular diastolic function not   assessed due to complete AV blocl.   2. Right ventricular systolic function is normal. The right ventricular  size is normal.   3. The mitral valve is normal in structure. Mild mitral valve  regurgitation. No evidence of mitral  stenosis.   4. The aortic valve is normal in structure. Aortic valve regurgitation is  mild. No aortic stenosis is present.    Physical Exam Vitals and nursing note reviewed.  Constitutional:      General: She is not in acute distress. Neck:     Vascular: No JVD.  Cardiovascular:     Rate and Rhythm: Normal rate and regular rhythm.     Heart sounds: Murmur heard.     Harsh midsystolic murmur is present with a grade of 2/6 at the upper right sternal border radiating to the neck.  Pulmonary:     Effort: Pulmonary effort is normal.     Breath sounds: Normal breath sounds. No wheezing or rales.  Musculoskeletal:     Left lower leg: No edema.      VISIT DIAGNOSES:   ICD-10-CM   1. Complete heart block (HCC)  I44.2 EKG 12-Lead    2. Essential hypertension  I10 EKG 12-Lead    ECHOCARDIOGRAM COMPLETE    3. Murmur  R01.1 ECHOCARDIOGRAM COMPLETE       Felicia Davidson is a 78 y.o. female with hypertension, type 2 DM, CKD, neuropathy, anemia, s/p PPM fpr complete AV block  Assessment & Plan  Complete AV block: S/p pacemaker placement.   Functioning well. No echocardiogram since PPM placement, will check one, also given that she has 2/6 RUSB murmur on exam,   Hypertension: Well controlled. No changed made today.    F/u in 1 year  Signed, Newman JINNY Lawrence, MD

## 2023-11-30 ENCOUNTER — Ambulatory Visit: Attending: Cardiology | Admitting: Cardiology

## 2023-11-30 ENCOUNTER — Encounter: Payer: Self-pay | Admitting: Cardiology

## 2023-11-30 VITALS — BP 128/50 | HR 91 | Ht 64.0 in | Wt 168.0 lb

## 2023-11-30 DIAGNOSIS — R011 Cardiac murmur, unspecified: Secondary | ICD-10-CM

## 2023-11-30 DIAGNOSIS — I442 Atrioventricular block, complete: Secondary | ICD-10-CM | POA: Diagnosis not present

## 2023-11-30 DIAGNOSIS — I10 Essential (primary) hypertension: Secondary | ICD-10-CM | POA: Diagnosis not present

## 2023-11-30 NOTE — Patient Instructions (Signed)
  Testing/Procedures: Echocardiogram  Your physician has requested that you have an echocardiogram. Echocardiography is a painless test that uses sound waves to create images of your heart. It provides your doctor with information about the size and shape of your heart and how well your heart's chambers and valves are working. This procedure takes approximately one hour. There are no restrictions for this procedure. Please do NOT wear cologne, perfume, aftershave, or lotions (deodorant is allowed). Please arrive 15 minutes prior to your appointment time.  Please note: We ask at that you not bring children with you during ultrasound (echo/ vascular) testing. Due to room size and safety concerns, children are not allowed in the ultrasound rooms during exams. Our front office staff cannot provide observation of children in our lobby area while testing is being conducted. An adult accompanying a patient to their appointment will only be allowed in the ultrasound room at the discretion of the ultrasound technician under special circumstances. We apologize for any inconvenience.   Follow-Up: At [facility], you and your health needs are our priority.  As part of our continuing mission to provide you with exceptional heart care, our providers are all part of one team.  This team includes your primary Cardiologist (physician) and Advanced Practice Providers or APPs (Physician Assistants and Nurse Practitioners) who all work together to provide you with the care you need, when you need it.  Your next appointment:   1 year(s)  Provider:   Newman JINNY Lawrence, MD

## 2023-12-01 ENCOUNTER — Encounter: Payer: Self-pay | Admitting: Cardiology

## 2023-12-01 DIAGNOSIS — R011 Cardiac murmur, unspecified: Secondary | ICD-10-CM | POA: Insufficient documentation

## 2023-12-04 ENCOUNTER — Other Ambulatory Visit: Payer: Self-pay | Admitting: Cardiology

## 2023-12-04 DIAGNOSIS — I10 Essential (primary) hypertension: Secondary | ICD-10-CM

## 2023-12-14 ENCOUNTER — Ambulatory Visit: Payer: Medicare Other

## 2023-12-16 LAB — CUP PACEART REMOTE DEVICE CHECK
Battery Remaining Longevity: 100 mo
Battery Remaining Percentage: 79 %
Battery Voltage: 2.99 V
Brady Statistic AP VP Percent: 1 %
Brady Statistic AP VS Percent: 1 %
Brady Statistic AS VP Percent: 99 %
Brady Statistic AS VS Percent: 1 %
Brady Statistic RA Percent Paced: 1 %
Brady Statistic RV Percent Paced: 99 %
Date Time Interrogation Session: 20251204021903
Implantable Lead Connection Status: 753985
Implantable Lead Connection Status: 753985
Implantable Lead Implant Date: 20230602
Implantable Lead Implant Date: 20230602
Implantable Lead Location: 753859
Implantable Lead Location: 753860
Implantable Pulse Generator Implant Date: 20230602
Lead Channel Impedance Value: 410 Ohm
Lead Channel Impedance Value: 450 Ohm
Lead Channel Pacing Threshold Amplitude: 0.375 V
Lead Channel Pacing Threshold Amplitude: 0.375 V
Lead Channel Pacing Threshold Pulse Width: 0.5 ms
Lead Channel Pacing Threshold Pulse Width: 0.5 ms
Lead Channel Sensing Intrinsic Amplitude: 12 mV
Lead Channel Sensing Intrinsic Amplitude: 3 mV
Lead Channel Setting Pacing Amplitude: 0.625
Lead Channel Setting Pacing Amplitude: 1.375
Lead Channel Setting Pacing Pulse Width: 0.5 ms
Lead Channel Setting Sensing Sensitivity: 4 mV
Pulse Gen Model: 2272
Pulse Gen Serial Number: 8081700

## 2023-12-19 ENCOUNTER — Ambulatory Visit: Payer: Self-pay | Admitting: Cardiology

## 2023-12-20 NOTE — Progress Notes (Signed)
 Remote PPM Transmission

## 2024-01-09 ENCOUNTER — Ambulatory Visit (HOSPITAL_COMMUNITY)
Admission: RE | Admit: 2024-01-09 | Discharge: 2024-01-09 | Disposition: A | Discharge status: Home / Self Care | Source: Ambulatory Visit | Attending: Cardiology | Admitting: Cardiology

## 2024-01-09 ENCOUNTER — Ambulatory Visit: Payer: Self-pay | Admitting: Cardiology

## 2024-01-09 DIAGNOSIS — I10 Essential (primary) hypertension: Secondary | ICD-10-CM | POA: Insufficient documentation

## 2024-01-09 DIAGNOSIS — R011 Cardiac murmur, unspecified: Secondary | ICD-10-CM | POA: Insufficient documentation

## 2024-01-09 LAB — ECHOCARDIOGRAM COMPLETE
AR max vel: 1.66 cm2
AV Area VTI: 1.69 cm2
AV Area mean vel: 1.75 cm2
AV Mean grad: 10.6 mmHg
AV Peak grad: 20 mmHg
AV Vena cont: 0.2 cm
Ao pk vel: 2.23 m/s
Area-P 1/2: 4.06 cm2
MV M vel: 2.93 m/s
MV Peak grad: 34.3 mmHg
MV VTI: 1.25 cm2
Radius: 0.28 cm
S' Lateral: 3.08 cm

## 2024-01-09 NOTE — Progress Notes (Signed)
 Normal heart function. Mild aortic stenosis. Repeat echocardiogram in 06/2025.  Thanks MJP

## 2024-03-14 ENCOUNTER — Ambulatory Visit

## 2024-06-13 ENCOUNTER — Ambulatory Visit

## 2024-09-12 ENCOUNTER — Ambulatory Visit

## 2024-12-12 ENCOUNTER — Ambulatory Visit
